# Patient Record
Sex: Male | Born: 1940 | Race: Black or African American | Hispanic: No | Marital: Married | Smoking: Former smoker
Health system: Southern US, Community
[De-identification: ages and names within clinical notes are randomized; demographics above are authoritative.]

## PROBLEM LIST (undated history)

## (undated) DIAGNOSIS — K219 Gastro-esophageal reflux disease without esophagitis: Secondary | ICD-10-CM

## (undated) DIAGNOSIS — R011 Cardiac murmur, unspecified: Secondary | ICD-10-CM

## (undated) DIAGNOSIS — I10 Essential (primary) hypertension: Secondary | ICD-10-CM

## (undated) DIAGNOSIS — C8590 Non-Hodgkin lymphoma, unspecified, unspecified site: Secondary | ICD-10-CM

## (undated) DIAGNOSIS — J45909 Unspecified asthma, uncomplicated: Secondary | ICD-10-CM

## (undated) DIAGNOSIS — C801 Malignant (primary) neoplasm, unspecified: Secondary | ICD-10-CM

## (undated) DIAGNOSIS — I639 Cerebral infarction, unspecified: Secondary | ICD-10-CM

## (undated) HISTORY — PX: ANGIOPLASTY: SHX39

## (undated) HISTORY — DX: Unspecified asthma, uncomplicated: J45.909

## (undated) HISTORY — DX: Cardiac murmur, unspecified: R01.1

## (undated) HISTORY — DX: Cerebral infarction, unspecified: I63.9

## (undated) HISTORY — DX: Essential (primary) hypertension: I10

## (undated) HISTORY — DX: Gastro-esophageal reflux disease without esophagitis: K21.9

---

## 2000-01-26 ENCOUNTER — Inpatient Hospital Stay (HOSPITAL_COMMUNITY): Admission: EM | Admit: 2000-01-26 | Discharge: 2000-01-30 | Payer: Self-pay | Admitting: Emergency Medicine

## 2000-01-26 ENCOUNTER — Encounter: Payer: Self-pay | Admitting: Emergency Medicine

## 2000-01-27 ENCOUNTER — Encounter: Payer: Self-pay | Admitting: Internal Medicine

## 2000-02-21 ENCOUNTER — Encounter: Admission: RE | Admit: 2000-02-21 | Discharge: 2000-02-21 | Payer: Self-pay | Admitting: Hematology and Oncology

## 2000-06-20 ENCOUNTER — Encounter: Payer: Self-pay | Admitting: Emergency Medicine

## 2000-06-20 ENCOUNTER — Inpatient Hospital Stay (HOSPITAL_COMMUNITY): Admission: EM | Admit: 2000-06-20 | Discharge: 2000-06-24 | Payer: Self-pay | Admitting: Emergency Medicine

## 2000-06-23 ENCOUNTER — Encounter: Payer: Self-pay | Admitting: Internal Medicine

## 2006-04-04 ENCOUNTER — Emergency Department (HOSPITAL_COMMUNITY): Admission: EM | Admit: 2006-04-04 | Discharge: 2006-04-04 | Payer: Self-pay | Admitting: Emergency Medicine

## 2007-03-04 ENCOUNTER — Emergency Department (HOSPITAL_COMMUNITY): Admission: EM | Admit: 2007-03-04 | Discharge: 2007-03-05 | Payer: Self-pay | Admitting: Emergency Medicine

## 2010-11-12 ENCOUNTER — Emergency Department (HOSPITAL_COMMUNITY)
Admission: EM | Admit: 2010-11-12 | Discharge: 2010-11-12 | Disposition: A | Discharge status: Home / Self Care | Payer: Medicare PPO | Attending: Emergency Medicine | Admitting: Emergency Medicine

## 2010-11-12 ENCOUNTER — Emergency Department (HOSPITAL_COMMUNITY): Payer: Medicare PPO

## 2010-11-12 DIAGNOSIS — R061 Stridor: Secondary | ICD-10-CM | POA: Insufficient documentation

## 2010-11-12 DIAGNOSIS — R0602 Shortness of breath: Secondary | ICD-10-CM | POA: Insufficient documentation

## 2010-11-12 DIAGNOSIS — R634 Abnormal weight loss: Secondary | ICD-10-CM | POA: Insufficient documentation

## 2010-11-12 DIAGNOSIS — Z87898 Personal history of other specified conditions: Secondary | ICD-10-CM | POA: Insufficient documentation

## 2010-11-12 LAB — COMPREHENSIVE METABOLIC PANEL
ALT: 22 U/L (ref 0–53)
AST: 33 U/L (ref 0–37)
Alkaline Phosphatase: 70 U/L (ref 39–117)
CO2: 28 mEq/L (ref 19–32)
Calcium: 8.8 mg/dL (ref 8.4–10.5)
Chloride: 100 mEq/L (ref 96–112)
GFR calc Af Amer: >60 mL/min (ref >60)
GFR calc non Af Amer: >60 mL/min (ref >60)
Glucose, Bld: 109 mg/dL — ABNORMAL HIGH (ref 70–99)
Sodium: 137 mEq/L (ref 135–145)
Total Bilirubin: 0.5 mg/dL (ref 0.3–1.2)

## 2010-11-12 LAB — CBC
Hemoglobin: 13.5 g/dL (ref 13.0–17.0)
MCH: 24.1 pg — ABNORMAL LOW (ref 26.0–34.0)
MCHC: 33.2 g/dL (ref 30.0–36.0)
Platelets: 252 10*3/uL (ref 150–400)
RDW: 14.7 % (ref 11.5–15.5)

## 2010-11-12 LAB — DIFFERENTIAL
Basophils Absolute: 0.1 10*3/uL (ref 0.0–0.1)
Eosinophils Absolute: 0.1 10*3/uL (ref 0.0–0.7)
Lymphs Abs: 2.9 10*3/uL (ref 0.7–4.0)
Monocytes Absolute: 0.3 10*3/uL (ref 0.1–1.0)
Neutro Abs: 2.1 10*3/uL (ref 1.7–7.7)

## 2010-12-12 ENCOUNTER — Emergency Department (HOSPITAL_COMMUNITY)
Admission: EM | Admit: 2010-12-12 | Discharge: 2010-12-12 | Disposition: A | Discharge status: Home / Self Care | Payer: Medicare PPO | Attending: Emergency Medicine | Admitting: Emergency Medicine

## 2010-12-12 DIAGNOSIS — I10 Essential (primary) hypertension: Secondary | ICD-10-CM | POA: Insufficient documentation

## 2010-12-12 DIAGNOSIS — R112 Nausea with vomiting, unspecified: Secondary | ICD-10-CM | POA: Insufficient documentation

## 2010-12-12 DIAGNOSIS — Z87898 Personal history of other specified conditions: Secondary | ICD-10-CM | POA: Insufficient documentation

## 2010-12-17 DIAGNOSIS — I639 Cerebral infarction, unspecified: Secondary | ICD-10-CM

## 2010-12-17 HISTORY — DX: Cerebral infarction, unspecified: I63.9

## 2010-12-22 ENCOUNTER — Emergency Department (HOSPITAL_COMMUNITY): Payer: Medicare PPO

## 2010-12-22 ENCOUNTER — Encounter (HOSPITAL_COMMUNITY): Payer: Self-pay | Admitting: Radiology

## 2010-12-22 ENCOUNTER — Inpatient Hospital Stay (HOSPITAL_COMMUNITY)
Admission: EM | Admit: 2010-12-22 | Discharge: 2010-12-24 | DRG: 065 | Disposition: A | Discharge status: Home Health | Payer: Medicare PPO | Attending: Internal Medicine | Admitting: Internal Medicine

## 2010-12-22 DIAGNOSIS — Y92009 Unspecified place in unspecified non-institutional (private) residence as the place of occurrence of the external cause: Secondary | ICD-10-CM

## 2010-12-22 DIAGNOSIS — E876 Hypokalemia: Secondary | ICD-10-CM | POA: Diagnosis present

## 2010-12-22 DIAGNOSIS — M6282 Rhabdomyolysis: Secondary | ICD-10-CM | POA: Diagnosis present

## 2010-12-22 DIAGNOSIS — R112 Nausea with vomiting, unspecified: Secondary | ICD-10-CM | POA: Diagnosis present

## 2010-12-22 DIAGNOSIS — Z87898 Personal history of other specified conditions: Secondary | ICD-10-CM

## 2010-12-22 DIAGNOSIS — I10 Essential (primary) hypertension: Secondary | ICD-10-CM | POA: Diagnosis present

## 2010-12-22 DIAGNOSIS — R42 Dizziness and giddiness: Secondary | ICD-10-CM | POA: Diagnosis present

## 2010-12-22 DIAGNOSIS — I6350 Cerebral infarction due to unspecified occlusion or stenosis of unspecified cerebral artery: Principal | ICD-10-CM | POA: Diagnosis present

## 2010-12-22 DIAGNOSIS — W010XXA Fall on same level from slipping, tripping and stumbling without subsequent striking against object, initial encounter: Secondary | ICD-10-CM | POA: Diagnosis present

## 2010-12-22 HISTORY — DX: Malignant (primary) neoplasm, unspecified: C80.1

## 2010-12-22 LAB — DIFFERENTIAL
Basophils Relative: 1 % (ref 0–1)
Eosinophils Relative: 1 % (ref 0–5)
Monocytes Absolute: 0.6 10*3/uL (ref 0.1–1.0)
Neutro Abs: 6.2 10*3/uL (ref 1.7–7.7)
Neutrophils Relative %: 78 % — ABNORMAL HIGH (ref 43–77)

## 2010-12-22 LAB — CBC
HCT: 38.7 % — ABNORMAL LOW (ref 39.0–52.0)
MCH: 23.6 pg — ABNORMAL LOW (ref 26.0–34.0)
MCV: 72.5 fL — ABNORMAL LOW (ref 78.0–100.0)
RBC: 5.34 MIL/uL (ref 4.22–5.81)
RDW: 15.2 % (ref 11.5–15.5)
WBC: 8 10*3/uL (ref 4.0–10.5)

## 2010-12-22 LAB — COMPREHENSIVE METABOLIC PANEL
Alkaline Phosphatase: 72 U/L (ref 39–117)
BUN: 7 mg/dL (ref 6–23)
Chloride: 101 mEq/L (ref 96–112)
Creatinine, Ser: 0.86 mg/dL (ref 0.4–1.5)
Glucose, Bld: 116 mg/dL — ABNORMAL HIGH (ref 70–99)
Potassium: 2.9 mEq/L — ABNORMAL LOW (ref 3.5–5.1)
Total Bilirubin: 0.3 mg/dL (ref 0.3–1.2)

## 2010-12-22 LAB — POCT CARDIAC MARKERS: Myoglobin, poc: 218 ng/mL (ref 12–200)

## 2010-12-22 LAB — CARDIAC PANEL(CRET KIN+CKTOT+MB+TROPI)
Total CK: 902 U/L — ABNORMAL HIGH (ref 7–232)
Troponin I: <0.3 ng/mL (ref <0.30)

## 2010-12-23 ENCOUNTER — Inpatient Hospital Stay (HOSPITAL_COMMUNITY): Payer: Medicare PPO

## 2010-12-23 DIAGNOSIS — I359 Nonrheumatic aortic valve disorder, unspecified: Secondary | ICD-10-CM

## 2010-12-23 DIAGNOSIS — I6350 Cerebral infarction due to unspecified occlusion or stenosis of unspecified cerebral artery: Secondary | ICD-10-CM

## 2010-12-23 LAB — BASIC METABOLIC PANEL
BUN: 5 mg/dL — ABNORMAL LOW (ref 6–23)
CO2: 25 mEq/L (ref 19–32)
Calcium: 8.8 mg/dL (ref 8.4–10.5)
Creatinine, Ser: 0.84 mg/dL (ref 0.4–1.5)
Glucose, Bld: 90 mg/dL (ref 70–99)

## 2010-12-23 LAB — COMPREHENSIVE METABOLIC PANEL
AST: 23 U/L (ref 0–37)
Albumin: 3.5 g/dL (ref 3.5–5.2)
Chloride: 101 mEq/L (ref 96–112)
Creatinine, Ser: 0.99 mg/dL (ref 0.4–1.5)
GFR calc Af Amer: >60 mL/min (ref >60)
Total Bilirubin: 0.3 mg/dL (ref 0.3–1.2)

## 2010-12-23 LAB — CARDIAC PANEL(CRET KIN+CKTOT+MB+TROPI)
Relative Index: 0.8 (ref 0.0–2.5)
Troponin I: <0.3 ng/mL (ref <0.30)

## 2010-12-23 LAB — CBC
MCH: 23.8 pg — ABNORMAL LOW (ref 26.0–34.0)
MCV: 73.5 fL — ABNORMAL LOW (ref 78.0–100.0)
Platelets: 220 10*3/uL (ref 150–400)
RBC: 5.43 MIL/uL (ref 4.22–5.81)
RDW: 15.3 % (ref 11.5–15.5)

## 2010-12-23 LAB — LIPID PANEL
Cholesterol: 155 mg/dL (ref 0–200)
HDL: 93 mg/dL (ref >39)
Total CHOL/HDL Ratio: 1.7 RATIO
Triglycerides: 37 mg/dL (ref <150)
VLDL: 7 mg/dL (ref 0–40)

## 2010-12-23 MED ORDER — GADOBENATE DIMEGLUMINE 529 MG/ML IV SOLN
15.0000 mL | Freq: Once | INTRAVENOUS | Status: AC | PRN
  Administered 2010-12-23: 15 mL via INTRAVENOUS

## 2010-12-23 MED ORDER — IOHEXOL 350 MG/ML SOLN
100.0000 mL | Freq: Once | INTRAVENOUS | Status: AC | PRN
  Administered 2010-12-23: 100 mL via INTRAVENOUS

## 2010-12-24 LAB — URINALYSIS, ROUTINE W REFLEX MICROSCOPIC
Glucose, UA: NEGATIVE mg/dL
Ketones, ur: NEGATIVE mg/dL
Specific Gravity, Urine: 1.023 (ref 1.005–1.030)
pH: 6 (ref 5.0–8.0)

## 2010-12-24 LAB — LIPID PANEL
Cholesterol: 132 mg/dL (ref 0–200)
HDL: 76 mg/dL (ref >39)
LDL Cholesterol: 46 mg/dL (ref 0–99)
Triglycerides: 50 mg/dL (ref <150)

## 2010-12-27 NOTE — Consult Note (Signed)
NAME:  LOGHAN, KURTZMAN NO.:  [id number]  MEDICAL RECORD NO.:  [id number]           PATIENT TYPE:  I  LOCATION:  1408                         FACILITY:  [facility]  PHYSICIAN:  Thana Farr, MD    DATE OF BIRTH:  May 03, 1941  DATE OF CONSULTATION:  12/23/2010 DATE OF DISCHARGE:                                CONSULTATION   REFERRING PHYSICIAN:  Thad Ranger, MD  HISTORY:  Mr. Dahir is a 70 year old male who reports that he had been having some symptoms of feeling a little dizzy and nauseous, but yesterday he had acute onset of dizziness.  He was unable to maintain himself upright and fell to the ground.  Was able to crawl into the house and then had multiple episodes of nauseousness and vomiting.  The patient presented to the ED at that time.  Workup has included MRI of the head that shows a right-sided inferior cerebellovermal infarct.  MRA has also been performed that shows occlusion of the right vertebral artery.  There was a question of dissection.  Consult called for further recommendations.  PAST MEDICAL HISTORY:  Hypertension, asthma, lymphoma status post chemo and radiation.  MEDICATIONS:  Aspirin, lisinopril, Zofran, Protonix, meclizine, subcu heparin, Phenergan .  ALLERGIES:  No known drug allergies.  PHYSICAL EXAMINATION:  VITAL SIGNS:  Blood pressure 193/88, heart rate 52, respiratory rate 20, temperature 98.3. MENTAL STATUS:  On mental status testing, the patient is alert and oriented.  He can follow commands without difficulty.  Speech is fluent. On cranial nerve testing:  II, disks flat bilaterally, visual fields grossly intact.  III, IV, VI, extraocular movements intact.  V and VII, mild left facial droop.  VIII, grossly intact.  IX and X, positive gag. XI, bilateral shoulder shrug.  XII, midline tongue extension.  On motor exam, the patient is 5/5 throughout.  There is normal tone and bulk. Sensory, pinprick and light touch are intact  bilaterally.  Deep tendon reflexes are 1+ in the upper extremities, trace at the knees and absent at the ankles.  Plantars, mute on the left and downgoing on the right. On cerebellar testing, finger-to-nose and heel-to-shin intact bilaterally.  LABORATORY DATA:  Shows a sodium of 136, potassium 3.5, chloride 102, bicarb of 25, BUN 5, creatinine 0.84, glucose 90, hemoglobin and hematocrit of 12.6 and 38.7 respectively.  White blood cell count 8.0, hemoglobin A1c 5.9, triglycerides 37, cholesterol 155, HDL 93, LDL 55. Films reviewed.  ASSESSMENT:  Mr. Teare is a 70 year old male who has had a left cerebellar infarct.  Risk factors include hypertension.  MRA imaging shows occlusion of the left vertebral and possible dissection.  On viewing the films, it does seem that this is much more likely an occlusion than a dissection.  Embolus is a possible etiology due to its abrupt cutoff.  Further workup is indicated.  PLAN: 1. The patient to have echocardiogram. 2. The patient to continue on aspirin 325 mg daily for stroke     prophylaxis.  We would not start any medications     such as heparin and/or Coumadin unless a true embolic source is  noted. 3. PT consult. 4. CTA of the neck.          ______________________________ Thana Farr, MD     LR/MEDQ  D:  12/23/2010  T:  12/23/2010  Job:  [id number]  Electronically Signed by Thana Farr MD on 12/27/2010 07:58:20 AM

## 2010-12-29 NOTE — Discharge Summary (Signed)
NAME:  Derek Stevens, Derek Stevens             ACCOUNT NO.:  [id number]  MEDICAL RECORD NO.:  [id number]           PATIENT TYPE:  I  LOCATION:  1408                         FACILITY:  [facility]  PHYSICIAN:  Thad Ranger, MD       DATE OF BIRTH:  1941/04/19  DATE OF ADMISSION:  12/22/2010 DATE OF DISCHARGE:                        DISCHARGE SUMMARY - REFERRING   PRIMARY CARE PHYSICIAN:  Tanya D. Daphine Deutscher, M.D.; [facility].  DISCHARGE DIAGNOSES: 1. Acute cerebellar cerebrovascular accident. 2. Hypertension, poorly controlled. 3. Hyperlipidemia. 4. History of asthma. 5. Nausea and vomiting, improved. 6. Occlusion of the vertebral artery, bilateral, at origin.  CONSULTATION:  Neurology, Thana Farr, MD.  DISCHARGE MEDICATIONS:  Include amlodipine 10 mg p.o. daily, aspirin 325 mg p.o. daily, lisinopril 20 mg p.o. daily at bedtime, meclizine 12.5 mg p.o. b.i.d., Protonix 40 mg p.o. daily, Promethazine 12.5 mg p.o. every 6 hours as needed for nausea and vomiting, Crestor 10 mg p.o. daily, Colace 100 mg p.o. twice daily as needed, eyedrops saline 1 drop in both eyes daily as needed.  BRIEF HISTORY OF PRESENT ILLNESS:  At the time of admission, briefly Derek Stevens is a 70 year old male who presented with acute dizziness resulting in fall and persistent nausea and vomiting.  For details, please refer to admission note dictated by Dr. Althea Charon on Dec 22, 2010.  RADIOLOGICAL DATA:  Chest x-ray two-view, Dec 22, 2010, no active cardiopulmonary disease, stable chest.  CT head without contrast on Dec 22, 2010, no acute intracranial findings, periventricular white matter disease.  MRI of the brain on Dec 23, 2010, showed acute/subacute infarct involving the right side of the inferior cerebellar vermis likely accounting for the patient's acute onset of dizziness.  Diffuse white matter disease was advanced for age, this likely reflects sequelae of chronic microvascular ischemia.   Minimal mucosal thickening throughout the paranasal sinuses as described.  Multiple punctate areas of susceptibility compatible with remote hemorrhage, this suggests the possibility of amyloid angiopathy.  MRA, absent signal  in the right vertebral artery with reconstitution in the terminal, 1 cm, this is concerning with occlusion of his proximal stenosis or potentially destruction.  Mild distal small vessel disease.  CT angiogram of neck on Dec 23, 2010, showed vertebral arteries are occluded at the origins bilaterally.  The left vertebral artery is reconstituted at C5-C6 but remains irregular throughout its course in the neck.  Right  vertebral artery is partially reconstituted at the skull base.  Intracranial left vertebral artery appears normal.  A 2-D echocardiogram on Dec 23, 2010, showed EF of 60%, normal wall motion, and no wall motion abnormalities.  LABORATORY DATA:  Pertinent lab diagnostic data, HbA1c 6.0.  UA negative for any UTI or proteinuria.  Lipid profile showed cholesterol 132, HDL 76, LDL 46.  BRIEF HOSPITALIZATION COURSE:  Derek Stevens is a 70 year old male who presented with persistent nausea, vomiting and acute dizziness resulting in fall: 1. Persistent nausea, vomiting and acute dizziness/acute cerebellar     infarct.  The patient was admitted to the hospitalist service and     was started on IV fluids and antiemetics.  CT head was  obtained,     however, did not show any acute intracranial findings to further     workup and rule out any stroke.  MRI was obtained which did confirm     cerebellar acute/subacute CVA.  Stroke workup was initiated and the     patient was placed on full-dose aspirin, BP control and the     statins.  MRA did show occlusion at the region of the vertebral     arteries.  Neurology was consulted and per recommendation CT     angiogram of the neck was obtained.  The patient does have     reconstitution of the vertebral arteries and  collaterals and per     Neurology no other further workup is needed at this point except     risk modification.  This was clearly explained to the patient and     his family by myself and Dr. Thana Farr.  Physical Therapy     consultation was obtained and the patient will need home PT/OT     setup at the time of discharge. 2. Hypertension, was poorly controlled.  The patient was started on     lisinopril and Norvasc.  The patient will be discharged home today.  DISCHARGE FOLLOWUP:  With Dr. Alwyn Pea within next 2 weeks.  DISCHARGE PHYSICAL EXAMINATION:  VITAL SIGNS:  At the time of discharge, temperature 98.4, pulse 62, respirations 16, blood pressure 158/81, O2 sats 98% on room air. GENERAL:  The patient is alert, awake and oriented x3, not in any acute distress. HEENT:  Anicteric sclerae, pink conjunctivae.  Pupils are reactive to light and accommodation.  EOMI. NECK:  Supple.  No lymphopathy, no JVD. CVS:  S1, S2 clear.  Regular rate and rhythm. CHEST:  Clear to auscultation bilaterally. ABDOMEN:  Soft, nontender, nondistended.  Normal bowel sounds. EXTREMITIES:  No cyanosis, clubbing or edema noted in upper or lower extremities bilaterally.  DISCHARGE TIME:  35 minutes.     Thad Ranger, MD     RR/MEDQ  D:  12/24/2010  T:  12/24/2010  Job:  [id number]  cc:   Tanya D. Daphine Deutscher, M.D. Fax: [phone]  Thana Farr, MD  Electronically Signed by Andres Labrum Charese Abundis  on 12/29/2010 01:35:14 PM

## 2010-12-30 NOTE — H&P (Signed)
[place]  Patient:    Derek Stevens, Derek Stevens                      MRN: [id number] Adm. Date:  01/26/00 Attending:  Madaline Guthrie, M.D. Dictator:   Rennis Petty, M.D.                         History and Physical  HISTORY OF PRESENT ILLNESS:  Mr. Burgeson is a 70 year old African-American male with past medical history positive for Hodgkins lymphoma diagnosed in 1994.  He presented to the ED with complaints of subjective fever and chills, night sweats, nonproductive cough for the last six days.  He also has bilateral upper chest pain which is constant, nonpleuritic in nature and no associated shortness of breath.  He denies sick contacts.  He has a history of unintentional 10-pound weight loss for the last month, abdominal pain two days ago associated with diarrhea, but no nausea or vomiting.  He denies bright red blood per rectum, blood in stool, melena.  Denies dysuria or hematuria or hesitancy.  Denies rashes or focal numbness or weakness.  He does have a bifrontal headache times the last two days but no particular visual changes.  PAST MEDICAL HISTORY: 1. 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    a. Status post radiation and chemotherapy in 1994.    b. Stage IIA. 2. Untreated hypertension. 3. Chronic sinusitis. 4. Childhood asthma.  FAMILY HISTORY:  His mother has breast cancer as well as cerebral aneurysm. Her age is 64.  His father and brother have lung cancer.  No history of coronary artery disease in the family.  SOCIAL HISTORY:  Lives with his wife in [place].  Does concrete work. Remote history of tobacco abuse 20 years ago.  Remote history of alcohol use. No history of IV drug abuse.  ALLERGIES:  No known drug allergies.  MEDICATIONS:  None.  PHYSICAL EXAMINATION:  VITAL SIGNS:  Temperature 97.5, blood pressure 159/86, heart rate 106, respiratory rate 20, O2 saturation 95% on room air.  GENERAL:  Patient is ill-appearing;  however, alert.  HEENT:  He has bilateral gray tympanic membranes.  He has a coated tongue. Mild erythema in his posterior pharynx; otherwise, NAD.  NECK:  Supple.  No lymphadenopathy.  No bruit.  LUNGS:  Crackles, right base.  Egophony, right base as well as mid-lung fields.  Poor air movement, right base.  CARDIOVASCULAR:  Regular with frequent ectopy.  A 2/6 systolic murmur at apex which does not radiate.  ABDOMEN:  Positive bowel sounds.  Nontender.  No hepatosplenomegaly.  EXTREMITIES:  No pedal edema.  SKIN:  No rash.  NEUROLOGIC:  Cranial nerves intact.  Motor 5/5 throughout.  Deep tendon reflexes globally reduced.  Plantar reflex downgoing.  LABORATORY AND X-RAY FINDINGS:  Sodium 121, potassium 3.6, chloride 85, bicarb 25, BUN 8, creatinine 1.3, glucose 138, calcium 9.3, total protein 8.1, albumin 3.3, SGOT 282, SGPT 92, alkaline phosphatase 69, total bilirubin 0.9. Liver function tests within normal limits in 1996.  CBC includes WBC of 11.8, hemoglobin 13.1, MCV of 73, platelets 242,000.  EKG:  Normal sinus rhythm.  No ST-T wave changes.  Normal axis.  Chest x-ray:  Right lower lobe as well as right middle lobe pneumonia infiltrate, known effusions.  ASSESSMENT:  Mr. Lenn is a 70 year old gentleman with history of Hodgkins lymphoma that is stage IIA, status post radiation and chemotherapy. Based on his chest  x-ray as well as examination findings, he could have a community-acquired pneumonia.  1. Pneumonia:  Mr. Rash most likely has a community/aspiration pneumonia.    Though he denies his alcohol abuse, he has had multiple records which have    reported that he has abused alcohol as recent as 1996; therefore, common    pathogens would include Streptococcus pneumoniae of most, however, he may    have tendencies for aspiration and he could have anaerobes as well.  This    is also consistent with, as he has a history of Hodgkins and chemotherapy,    a  postobstructive process that he could also have, which is not visible on    chest x-ray; therefore, we will start Zosyn/______ which will cover both    Streptococcus pneumoniae as well as anaerobes. 2. Hyponatremia:  Patients sodium is 121.  It has been normal in the past.    In the emergency department, he did receive 500 cc normal saline bolus.  We    will check urine sodium, urine osmolality and serum osmolality.  Most    likely, based on his urinalysis which reveals a normal specific gravity as    well as his clinical examination, I feel he either has euvolemic or    hypervolemic hyponatremia; therefore, we will fluid restrict him today and    review his results and make decisions accordingly. 3. Elevated liver function tests:  As I stated previously, his liver function    tests were normal in 1996; therefore, we have to wonder if this is    alcoholic hepatitis/fatty liver, acute versus chronic hepatitis as well as    metastasis to his liver.  We will start with starting with acute hepatitis    panel and he may require CT of his abdomen for further workup.  We should    also consider a CT chest, as he may have a mass which could be causing    metastasis to his liver. 4. Hyperglycemia:  Will follow complete blood glucoses early in the morning    and follow him.  ______ will be the primary physician taking care of this    patient during his hospitalization. DD:  01/26/00 TD:  01/27/00 Job: [id number]

## 2010-12-30 NOTE — Discharge Summary (Signed)
[place]  Patient:    Derek Stevens, Derek Stevens                    MRN: [id number] Adm. Date:  [id number] Disc. Date: [id number] Attending:  Edwyna Perfect Dictator:   Melissa L. Susann Givens, M.D.                           Discharge Summary  DATE OF BIRTH:  August 13, 1941  CONSULTANTS: 1. Cardiology.  [facility] was consulted secondary to the patients    history and complaints of substernal chest pain and pressure for two days    that occurred at rest.  He was started initially on heparin and    nitroglycerin and was found to have positive cardiac enzymes, which [facility]    felt was not indicative of myocardial injury, however the symptoms of chest    pain were worrisome for unstable angina.  Patient then went on to a cardiac    catheterization and was found to have no serious coronary artery disease.    His left ventricular ejection fraction was 55.4%.  He had mild global    hypokinesis. His aortic root was dilated but without aortic insufficiency.    Aortic dilation extended to the renals.  Renal arteries were found to be    patent.  Cardiology recommended a follow-up CT scan to rule out    thoracoabdominal aortic aneurysm, but test was performed and    thoracoabdominal aortic aneurysm was not found on CT.  Cardiology also    recommended for the patients history of hypertension a beta-blocker while    in hospital, but recommended, given his not having any coronary artery    disease to use diuretics, an Ace inhibitor or a calcium channel blocker to    control the patients blood pressure. 2. Hematology/Oncology.  They were consulted for the patients history of    Hodgkins lymphoma as well as, to rule out hemachromatosis.  The patient    was noted to be anemic on admission with a hemoglobin of 12.0 and MCV of    72, white blood cells of 5.9, and platelet count of 149.  The anemia    studies were done and indicated iron was 249, which was elevated  to    iron-binding capacity of 262% and percent saturation 95%.  Folate    was 242, serum ferritin was 3099, transferrin was 165.  Total bilirubin,    SGOT and SGPT were also elevated.  Peripheral smear showed polychromasia    and burr cells.  Per their assessment, hematology felt that a CT of the    chest, abdomen and pelvis were warranted to rule out recurrence of    lymphoma. That test was performed and there was no adenopathy noted on    CT.  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  The fact that his    ferritin serum iron were  elevated would go along with that diagnosis.    Per hematology, hemachromatosis is almost unheard of in African-American    population.  There recommendation was to counsel the patient to stop    drinking alcohol and recheck his parameters once he has abstained from    alcohol.  They did not recommend a liver biopsy at present and felt    there was no acute  need to lower his serum iron emergently.  PROCEDURE: Cardiac catheterization done on June 22, 2000.  INITIAL HISTORY AND PHYSICAL EXAMINATION:  This is a 70 year old African-American male with a history of Hodgkins lymphoma, who presented to the [facility] Emergency Department with a two day history of chest pain that had worsened on the day of admission.  Patient felt the pain was constant and radiated up his neck.  Last night before admission, patient had nightsweats and felt that nothing improved the pain and he felt the pain was a 10/10 when he arrived at the ED.  When I initially saw the patient, he felt the pain was down to a 3/4.  He complains of associated nausea, vomiting x 3 on the day of admission, but no fevers, chills and no symptoms of reflux.  PAST MEDICAL HISTORY: 1. 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 2. Untreated hypertension. 3. Lymphoma stage 2A  diagnosed in 1994, treated with radiation therapy and    chemotherapy.  MEDICATIONS:  None.  ALLERGIES:  No known allergies.  There is no personal history of GI bleed, coagulopathy or CVA.  FAMILY HISTORY:  His mother has hypertension and a cerebral aneurysm.  His father died of a blood clot and had lung cancer.  His brother has cancer of the lung.  SOCIAL HISTORY:  The patient lives with his wife of 36 years.  He has a history of tobacco use, but quit in the 70s after smoking cigars.  He admits to ETOH use at least three beers per day.  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  INITIAL LABORATORIES:  Troponin was 0.09, CK was 559 and CK-MB was 4.2, PT was 14.2, hemoglobin was 12.0 with an MCV of 72.  Blood pressures were 168/107. TSH was found to be 21.  INITIAL PHYSICAL EXAMINATION:  GENERAL:  He is awake and oriented x 3.  HEENT:  He has scleral icterus, but no cervical adenopathy, no supraclavicular adenopathy.  Mucous membranes are moist.  He had poor dentition.  There is no JVD, no bruits, and no jugular/hepatojugular reflux.  LUNGS:  Clear to auscultation bilaterally.  CHEST:  There was no chest pain to palpation.  Heart rate was regular without murmurs.  ABDOMEN:  Soft, nontender with good bowel sounds.  The liver was two fingerbreadths below the costal margin.  He had no splenomegaly or palpable masses.  Groin was without adenopathy.  VASCULAR EXAMINATION:  Femoral and brachial pulses were equal in intensity and simultaneous and upstrokes of the carotids had strong upstrokes.  EXTREMITIES:  No edema.  HOSPITAL COURSE:  Patient was admitted to the cardiac floor with a diagnosis of unstable angina, was started on Lopressor 50 mg p.o. b.i.d., Altace 2.5 mg p.o. q.d., nitroglycerin and heparin drip, as well as, Pepcid and aspirin. Patient went on to cardiac catheterization on day two of admission.  He was found to have no coronary artery disease.  Patient  on day three of admission underwent a CT as patient had two dye loads on day two of admission, it was felt to watch patient another 24 hours to ensure that his creatinine did not  rise, as his creatinine did not rise.  The patient was discharged to home on day four of admission.  He was stopped on the heparin, stopped on the nitroglycerin.  The beta-blocker was stopped as well, because it lowered his blood pressure too much.  He was changed to  Norvasc 2.5 mg q.day and he was started on Altace 5 mg everyday and he was also started on Synthroid 25 mcg everyday.  The patient was told to abstain from alcohol and was told not to drive, have any sexual activity or heavy lifting until this Tuesday.  The patient was informed that the outpatient clinic would call him with a follow-up appointment with Dr. ______ , who is his primary care doctor.  It is recommended that patient have his thyroid checked every month and that his Synthroid be increased by 25 mcg every month for three months, then evaluate whether or not it would be necessary to increase it further.  His electrolytes initially should be checked after a period of abstinence from alcohol and he is otherwise discharged home in stable condition. DD:  06/24/00 TD:  06/24/00 Job: [id number]

## 2010-12-30 NOTE — Discharge Summary (Signed)
. [facility]  Patient:    Derek Stevens, Derek Stevens                    MRN: [id number] Adm. Date:  [id number] Disc. Date: [id number] Attending:  Madaline Guthrie                           Discharge Summary  NO DICTATION DD:  01/26/00 TD:  01/31/00 Job: [id number]

## 2010-12-30 NOTE — Cardiovascular Report (Signed)
[place]. [facility]  Patient:    Derek Stevens, Derek Stevens                    MRN: [id number] Proc. Date: 06/22/00 Adm. Date:  [id number] Attending:  Edwyna Perfect CC:         C. Ulyess Mort, M.D.  Lewayne Bunting, M.D.  CV Laboratory   Cardiac Catheterization  INDICATIONS:  The patient was admitted with chest pain.  He also has hepatitis; a question as to whether this is related to alcohol or some other etiology.  He also has elevation of serum ferritin.  He has had a prior lymphoma with chest irradiation.  The current study was done to access coronary anatomy.  PROCEDURES: 1. Left heart catheterization. 2. Selective coronary arteriography. 3. Selective left ventriculography. 4. Proximal mid and distal aortography.  DESCRIPTION OF PROCEDURE:  The patient was brought to the catheterization lab and prepped and draped in the usual fashion.  Xylocaine 1% was used for local anesthesia.  Through an anterior puncture the right femoral artery was easily entered.  Ventriculography was performed in the RAO projection.  We noted that the aortic root appeared to be somewhat dilated and therefore aortic root aortography was performed.  Distal aortography was performed as well. Following this coronary arteriography was performed without complication.  He tolerated the procedure well and was taken to the holding area in satisfactory clinical condition.  HEMODYNAMICS:  The central aortic pressure was 152/91, LV pressure 143/6. There was no gradient on pullback across the aortic valve.  ANGIOGRAPHIC DATA:  VENTRICULOGRAPHY:  Ventriculography in the RAO projection reveals modest global hypokinesis.  Ejection fraction is 55%.  No segmental wall motion abnormalities noted.  The proximal aortic root appears to be dilated.  It is dilated in a generalized fashion.  This appears to extend all the way around to just above the renal arteries.  The renal arteries themselves  appear to be widely patent.   The left main coronary artery was free of critical disease.  The LAD courses to the apex and wraps the apical tip.  There are two diagonal branches.  All of these vessels appear to be free of critical disease.  The circumflex consists of a fairly large marginal branch that bifurcates distally.  This appears to be free of critical disease.  The right coronary artery is a dominant vessel providing a PDA and smaller posterolateral system.  The right coronary artery appears to be free of critical disease.  CONCLUSIONS: 1. Mild global hypokinesis. 2. Aortic root dilatation. 3. No critical coronary obstruction.  RECOMMENDATIONS: 1. Evaluate the patient for hemochromatosis. 2. CT scan to evaluate aortic size. DD:  06/22/00 TD:  06/23/00 Job: [id number]

## 2010-12-30 NOTE — Discharge Summary (Signed)
[place]. [facility]  Patient:    Derek Stevens, Derek Stevens                    MRN: [id number] Adm. Date:  [id number] Disc. Date: [id number] Attending:  Madaline Guthrie Dictator:   Rene Paci                           Discharge Summary  DATE OF BIRTH:  1941-07-17  DISCHARGE DIAGNOSES: 1. Right lower lobe pneumonia, resolving. 2. Hypothyroidism. 3. 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 4. Elevated liver function tests secondary to pneumonia, hepatitis negative,    resolving. 5. History of Hodgkins lymphoma of the thorax 1994, treated with radiation    and chemotherapy.  DISCHARGE MEDICATIONS: 1. Tequin 400 mg 1 tablet p.o. q.d. x 6 days. 2. Synthroid 50 mcg 1 tablet p.o. q.d.  PROCEDURES:  None.  CONSULTATIONS:  None.  FOLLOW-UP:  Hospital follow-up is scheduled for Thursday, February 21, 2000, at 10:30 a.m. at the [facility] with Dr. Felicity Coyer.  At this appointment, will review symptoms as related to hypothyroidism, as well as establish continuity of care.  The patient will need a repeat TSH level in approximately six weeks from now to adjust Synthroid dose as needed.  HISTORY OF PRESENT ILLNESS:  [age]-year-old African-American gentleman with past medical history of Hodgkins diagnosed in 1994, who presents to the emergency department with complaints of subjective fever and chills, night sweats, and a nonproductive cough for six days prior to admission.  He has had bilateral upper chest pain, nonpleuritic in nature, no shortness of breath. There is no radiation of his pain.  He denies any sick contacts.  He says he has unintentionally lost 10 pounds over the last month and had abdominal pain two days ago that was associated with diarrhea, but this has since resolved. No nausea or vomiting.  He denies hemoptysis.  He denies dysuria, hematuria, or hesitancy.  He denies any  rashes or focal numbness.  He has had no swelling.  ADMISSION PHYSICAL EXAMINATION:  VITAL SIGNS:  Temperature 97.5, later reaching 103.  Blood pressure 159/86, heart rate of 106, respiratory rate of 20, saturation 95% on room air.  GENERAL:  Ill-appearing, diaphoretic gentleman who is awake and alert, oriented x 4.  HEENT:  [place]/AT, PERRL, EOMI.  Tympanic membranes gray bilaterally.  Oropharynx with no lesions, dry tongue, mild erythema.  NECK:  Supple.  Without lymphadenopathy or thyromegaly.  LUNGS:  Significant decreased breath sounds at the right base with egophony. There are also crackles in the right and left base as you move anteriorly.  CARDIOVASCULAR:  Regular rate and rhythm with a 2/6 systolic murmur at the apex that does not radiate.  ABDOMEN:  Soft and nontender.  EXTREMITIES:  Without edema.  SKIN:  Without rash.  ADMISSION LABORATORY DATA:  Sodium of 121, potassium of 3.6, chloride of 85, bicarbonate of 25, BUN of 8, creatinine of 1.3, glucose of 138.  AST of 282, ALT of 92, alkaline phosphatase of 69, total bilirubin of 0.9.  White count of 11.8, hemoglobin of 13.1.  Chest x-ray shows a right lower lobe pneumonia, no effusions.  HOSPITAL COURSE: #1 - RIGHT LOWER LOBE PNEUMONIA:  The patient was admitted for treatment of this pneumonia primarily because of other associated problems such as his hyponatremia.  He was given Tequin IV for three days and  then converted without problem to p.o. Tequin.  He will be continued on this on an outpatient basis for a full 10-day course of his community-acquired pneumonia.  No sputum samples were ever obtained and, therefore, no culture results are available. He defervesced well, and his white count decreased accordingly to 6.4 by the time of discharge.  It is expected that he will recover well, without any residual complications as he completes his Tequin course at home as prescribed.  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  He was water restricted as is appropriate treatment for SIADH and, accordingly, his sodium resolved to a level of 133 at the time of discharge.  It is expected that as his pneumonia continues to resolve, so will his sodium correct itself.  Of note, see problem #3 for hypothyroidism for further details regarding sodium.  #3 - HYPOTHYROIDISM:  In the differential evaluation for hyponatremia, as hypothyroidism can cause low levels of sodium, a TSH was checked and found to be quite high.  This was repeated with a free T4 and again found to be consistent with hypothyroidism (TSH 37.6, FT4 is 0.67).  On the day of discharge the patient was begun on a starting dose of Synthroid 50 mcg a day and instructed on needing to take this medication daily.  TSH will be rechecked in approximately six weeks to adjust the Synthroid dose as needed. DD:  01/30/00 TD:  02/01/00 Job: [id number]

## 2011-01-02 NOTE — H&P (Signed)
NAME:  Derek Stevens, Derek Stevens NO.:  [id number]  MEDICAL RECORD NO.:  [id number]           PATIENT TYPE:  E  LOCATION:  WLED                         FACILITY:  [facility]  PHYSICIAN:  Zannie Cove, MD     DATE OF BIRTH:  [birth year]  DATE OF ADMISSION:  12/22/2010 DATE OF DISCHARGE:                             HISTORY & PHYSICAL   PRIMARY CARE PHYSICIAN:  The patient cannot recall at this point.  CHIEF COMPLAINT:  Dizziness, fall, nausea and vomiting.  HISTORY OF PRESENT ILLNESS:  Derek Stevens is a very pleasant 70 year old African American gentleman with remote history of lymphoma and hypertension who woke up this morning, ate breakfast, subsequently went to his garden and was working there doing nonexertional work and subsequently fell severely dizzy with the severe spinning sensation around him which caused him to fall.  He tried to stand up but he could not and he tried to lean against a tree which was next to him, subsequently called his way to his house since he could not stand due to the severe nausea.  In addition, he also felt a sensation of severe nausea and shaking in his arms and legs and this is accompanied by 5-6 episodes of vomiting.  Subsequently, he found his way inside his house, went and laid down on his bed, still felt severely dizzy.  Then his wife got home and they decided to get evaluate and called the EMS.  While EMS was evaluating him as well, he vomited couple of times.  Denies any abdominal pain except some vague abdominal discomfort after the vomiting episode which has since improved.  No history of fevers or chills.  No diarrhea, dysuria, or urinary frequency.  No changes in medications. For that matter, does not barely take any medicines.  He was here a month-and-half ago for pretty much the same thing wherein he felt similarly dizzy and fell and was given some Zofran and sent home and subsequently this is his second episode.  Denies any  history of headaches, seizures, trauma, etc.  PAST MEDICAL HISTORY: 1. Asthma. 2. Hypertension. 3. Lymphoma which was treated in 1994 with chemo and radiation,     considered in remission and disease free.  PAST SURGICAL HISTORY:  Significant for a lymph node biopsy.  SOCIAL HISTORY:  He is married with wife and has four children.  He lives with his wife at home.  Does not smoke cigarettes.  No history of illicit drug use.  Alcohol, he used to drink beer heavily before but cut down about 2 years ago and now drinks very occasionally, last drink was several weeks ago.  FAMILY HISTORY:  Significant for mother with CVA.  Father deceased secondary to COPD and lung cancer as well as brother with lung cancer.  REVIEW OF SYSTEMS:  Positive for weight loss and occasional symptoms of chronic ringing in his ears.  PHYSICAL EXAMINATION:  VITAL SIGNS:  Temperature is 98.2, pulse is 62, blood pressure 191/87, respirations 20, and saturating 96% on room air. GENERAL:  He is a [place] American gentleman who looks much older thanstated age. HEENT:  Pupils round  and reactive to light.  Extraocular movements intact.  No evidence of overt nystagmus.  Cranial nerves II-XII grossly intact. CARDIOVASCULAR:  S1-S2.  Regular rate and rhythm. LUNGS:  Clear to auscultation bilaterally. ABDOMEN:  Soft and nontender.  Positive bowel sounds.  No organomegaly. EXTREMITIES:  No edema, clubbing, or cyanosis. NEURO:  Strength is 5/5 in all extremities.  Reflexes diminished in his lower legs but 1-2, rest of his reflexes are 2/2.  Plantars are downgoing.  Coordination is intact.  No dysdiadochokinesia.  Gait not assessed.  REVIEW OF LABORATORY DATA:  White count 3.0, hemoglobin 12.6, and platelet count is adequate. BMET:  Sodium 137, potassium 2.9, chloride 101, bicarb 26, BUN 7, and creatinine 0.8.  Cardiac markers x1 negative. EKG shows normal sinus rhythm.  There is flattening of his T-waves in the  lateral leads, not significantly changed from prior.  ASSESSMENT AND PLAN:  Derek Stevens is a very pleasant 70 year old gentleman with remote history of lymphoma, now with: 1. Acute onset dizziness, fall, severe persistent nausea and vomiting. 2. Hypokalemia. 3. EKG with flat T-waves in inferior leads, unchanged from prior.  PLAN:  We will admit him to a tele bed today.  We will check an MRI to rule out posterior circulation infarct or cerebellar infarct.  We will start him on scheduled meclizine b.i.d. along with p.r.n. Zofran and Phenergan.  We will have physical therapy see him for vestibular rehab. If his current workup with MRI scans are negative, he will need referral to ENT to evaluate for Meniere disease or any other inner ear cause of vertigo.  We will also start him on aspirin 325 mg and check 2 sets of cardiac markers to make sure none of his symptoms are related to cardiac event.  Further management as condition evolves.     Zannie Cove, MD     PJ/MEDQ  D:  12/22/2010  T:  12/22/2010  Job:  [id number]  Electronically Signed by Zannie Cove  on 01/02/2011 08:07:20 PM

## 2011-02-10 ENCOUNTER — Emergency Department (HOSPITAL_COMMUNITY): Payer: Medicare PPO

## 2011-02-10 ENCOUNTER — Emergency Department (HOSPITAL_COMMUNITY)
Admission: EM | Admit: 2011-02-10 | Discharge: 2011-02-10 | Disposition: A | Discharge status: Home / Self Care | Payer: Medicare PPO | Attending: Emergency Medicine | Admitting: Emergency Medicine

## 2011-02-10 DIAGNOSIS — J45909 Unspecified asthma, uncomplicated: Secondary | ICD-10-CM | POA: Insufficient documentation

## 2011-02-10 DIAGNOSIS — E039 Hypothyroidism, unspecified: Secondary | ICD-10-CM | POA: Insufficient documentation

## 2011-02-10 DIAGNOSIS — Z9221 Personal history of antineoplastic chemotherapy: Secondary | ICD-10-CM | POA: Insufficient documentation

## 2011-02-10 DIAGNOSIS — I10 Essential (primary) hypertension: Secondary | ICD-10-CM | POA: Insufficient documentation

## 2011-02-10 DIAGNOSIS — R42 Dizziness and giddiness: Secondary | ICD-10-CM | POA: Insufficient documentation

## 2011-02-10 DIAGNOSIS — R112 Nausea with vomiting, unspecified: Secondary | ICD-10-CM | POA: Insufficient documentation

## 2011-02-10 DIAGNOSIS — Z87898 Personal history of other specified conditions: Secondary | ICD-10-CM | POA: Insufficient documentation

## 2011-02-10 LAB — URINALYSIS, ROUTINE W REFLEX MICROSCOPIC
Bilirubin Urine: NEGATIVE
Hgb urine dipstick: NEGATIVE
Ketones, ur: NEGATIVE mg/dL
Nitrite: NEGATIVE
Urobilinogen, UA: 0.2 mg/dL (ref 0.0–1.0)

## 2011-02-10 LAB — APTT: aPTT: 35 seconds (ref 24–37)

## 2011-02-10 LAB — CBC
HCT: 35.8 % — ABNORMAL LOW (ref 39.0–52.0)
MCH: 23.5 pg — ABNORMAL LOW (ref 26.0–34.0)
MCHC: 32.7 g/dL (ref 30.0–36.0)
MCV: 71.9 fL — ABNORMAL LOW (ref 78.0–100.0)
Platelets: 209 10*3/uL (ref 150–400)
RDW: 14.8 % (ref 11.5–15.5)

## 2011-02-10 LAB — BASIC METABOLIC PANEL
BUN: 5 mg/dL — ABNORMAL LOW (ref 6–23)
CO2: 29 mEq/L (ref 19–32)
Chloride: 105 mEq/L (ref 96–112)
Creatinine, Ser: 1 mg/dL (ref 0.50–1.35)
GFR calc Af Amer: >60 mL/min (ref >60)
Potassium: 3.7 mEq/L (ref 3.5–5.1)

## 2011-02-10 LAB — DIFFERENTIAL
Basophils Relative: 1 % (ref 0–1)
Eosinophils Absolute: 0.3 10*3/uL (ref 0.0–0.7)
Eosinophils Relative: 5 % (ref 0–5)
Lymphs Abs: 1.6 10*3/uL (ref 0.7–4.0)
Monocytes Absolute: 0.4 10*3/uL (ref 0.1–1.0)
Neutro Abs: 4 10*3/uL (ref 1.7–7.7)
Neutrophils Relative %: 62 % (ref 43–77)

## 2011-02-10 LAB — PROTIME-INR: INR: 1.02 (ref 0.00–1.49)

## 2011-05-29 LAB — I-STAT 8, (EC8 V) (CONVERTED LAB)
Bicarbonate: 24.1 — ABNORMAL HIGH
HCT: 43
Hemoglobin: 14.6
Operator id: 277751
Potassium: 3.8
Sodium: 127 — ABNORMAL LOW
TCO2: 25

## 2011-05-29 LAB — DIFFERENTIAL
Basophils Absolute: 0.1
Eosinophils Relative: 4
Lymphocytes Relative: 59 — ABNORMAL HIGH
Lymphs Abs: 2.6
Neutro Abs: 1.2 — ABNORMAL LOW

## 2011-05-29 LAB — POCT I-STAT CREATININE
Creatinine, Ser: 1.1
Operator id: 277751

## 2011-05-29 LAB — POCT CARDIAC MARKERS
CKMB, poc: 6.5
Operator id: 277751
Troponin i, poc: <0.05

## 2011-05-29 LAB — CBC
HCT: 37.7 — ABNORMAL LOW
Platelets: 263
RDW: 15.4 — ABNORMAL HIGH
WBC: 4.4

## 2012-07-23 ENCOUNTER — Encounter: Payer: Self-pay | Admitting: Gastroenterology

## 2012-08-06 ENCOUNTER — Encounter: Payer: Self-pay | Admitting: Gastroenterology

## 2012-08-06 ENCOUNTER — Ambulatory Visit (AMBULATORY_SURGERY_CENTER): Payer: Medicare PPO | Admitting: *Deleted

## 2012-08-06 VITALS — Ht 66.0 in | Wt 154.0 lb

## 2012-08-06 DIAGNOSIS — Z1211 Encounter for screening for malignant neoplasm of colon: Secondary | ICD-10-CM

## 2012-08-06 MED ORDER — MOVIPREP 100 G PO SOLR: 1.0000 | Freq: Once | ORAL | Status: DC

## 2012-08-16 ENCOUNTER — Encounter: Payer: Self-pay | Admitting: Gastroenterology

## 2012-08-16 ENCOUNTER — Ambulatory Visit (AMBULATORY_SURGERY_CENTER): Payer: Medicare PPO | Admitting: Gastroenterology

## 2012-08-16 VITALS — BP 138/78 | HR 46 | Temp 97.6°F | Resp 18

## 2012-08-16 DIAGNOSIS — D126 Benign neoplasm of colon, unspecified: Secondary | ICD-10-CM

## 2012-08-16 DIAGNOSIS — K635 Polyp of colon: Secondary | ICD-10-CM

## 2012-08-16 DIAGNOSIS — Z1211 Encounter for screening for malignant neoplasm of colon: Secondary | ICD-10-CM

## 2012-08-16 DIAGNOSIS — K5730 Diverticulosis of large intestine without perforation or abscess without bleeding: Secondary | ICD-10-CM

## 2012-08-16 MED ORDER — SODIUM CHLORIDE 0.9 % IV SOLN: 500.0000 mL | INTRAVENOUS | Status: DC

## 2012-08-16 NOTE — Progress Notes (Signed)
Per the pt he has a history of asthma.  He has an inhaler but according to the pt has not used it in years and does not have the inhaler with him today. Maw

## 2012-08-16 NOTE — Patient Instructions (Addendum)
YOU HAD AN ENDOSCOPIC PROCEDURE TODAY AT THE [facility]: Refer to the procedure report that was given to you for any specific questions about what was found during the examination.  If the procedure report does not answer your questions, please call your gastroenterologist to clarify.  If you requested that your care partner not be given the details of your procedure findings, then the procedure report has been included in a sealed envelope for you to review at your convenience later.  YOU SHOULD EXPECT: Some feelings of bloating in the abdomen. Passage of more gas than usual.  Walking can help get rid of the air that was put into your GI tract during the procedure and reduce the bloating. If you had a lower endoscopy (such as a colonoscopy or flexible sigmoidoscopy) you may notice spotting of blood in your stool or on the toilet paper. If you underwent a bowel prep for your procedure, then you may not have a normal bowel movement for a few days.  DIET: Your first meal following the procedure should be a light meal and then it is ok to progress to your normal diet.  A half-sandwich or bowl of soup is an example of a good first meal.  Heavy or fried foods are harder to digest and may make you feel nauseous or bloated.  Likewise meals heavy in dairy and vegetables can cause extra gas to form and this can also increase the bloating.  Drink plenty of fluids but you should avoid alcoholic beverages for 24 hours.  ACTIVITY: Your care partner should take you home directly after the procedure.  You should plan to take it easy, moving slowly for the rest of the day.  You can resume normal activity the day after the procedure however you should NOT DRIVE or use heavy machinery for 24 hours (because of the sedation medicines used during the test).    SYMPTOMS TO REPORT IMMEDIATELY: A gastroenterologist can be reached at any hour.  During normal business hours, 8:30 AM to 5:00 PM Monday through Friday,  call [phone].  After hours and on weekends, please call the GI answering service at [phone] who will take a message and have the physician on call contact you.   Following lower endoscopy (colonoscopy or flexible sigmoidoscopy):  Excessive amounts of blood in the stool  Significant tenderness or worsening of abdominal pains  Swelling of the abdomen that is new, acute  Fever of 100F or higher   FOLLOW UP: If any biopsies were taken you will be contacted by phone or by letter within the next 1-3 weeks.  Call your gastroenterologist if you have not heard about the biopsies in 3 weeks.  Our staff will call the home number listed on your records the next business day following your procedure to check on you and address any questions or concerns that you may have at that time regarding the information given to you following your procedure. This is a courtesy call and so if there is no answer at the home number and we have not heard from you through the emergency physician on call, we will assume that you have returned to your regular daily activities without incident.  SIGNATURES/CONFIDENTIALITY: You and/or your care partner have signed paperwork which will be entered into your electronic medical record.  These signatures attest to the fact that that the information above on your After Visit Summary has been reviewed and is understood.  Full responsibility of the confidentiality of   this discharge information lies with you and/or your care-partner.   Resume medications. Information given on polyps,diverticulosis and high fiber diet with discharge instructions. 

## 2012-08-16 NOTE — Progress Notes (Signed)
Patient did not experience any of the following events: a burn prior to discharge; a fall within the facility; wrong site/side/patient/procedure/implant event; or a hospital transfer or hospital admission upon discharge from the facility. (G8907) Patient did not have preoperative order for IV antibiotic SSI prophylaxis. (G8918)  

## 2012-08-16 NOTE — Op Note (Signed)
[facility] [address] [place], [address]   COLONOSCOPY PROCEDURE REPORT  PATIENT: Derek, Stevens  MR#: [id number] BIRTHDATE: [birth year] , 71  yrs. old GENDER: Male ENDOSCOPIST: Mardella Layman, MD, Clementeen Graham REFERRED BY:  Alwyn Pea, M.D. PROCEDURE DATE:  08/16/2012 PROCEDURE:   Colonoscopy with snare polypectomy ASA CLASS:   Class II INDICATIONS:Average risk patient for colon cancer. MEDICATIONS: Fentanyl-Detailed 180 mg IV  DESCRIPTION OF PROCEDURE:   After the risks and benefits and of the procedure were explained, informed consent was obtained.  A digital rectal exam revealed no abnormalities of the rectum.    The endoscope was introduced through the anus and advanced to the cecum, which was identified by both the appendix and ileocecal valve .  The quality of the prep was good, using MoviPrep .  The instrument was then slowly withdrawn as the colon was fully examined.     COLON FINDINGS: Images taken but only available in hard copy form that will be scanned into EPIC [employer]).   Moderate diverticulosis was noted.   A smooth flat polyp ranging between 3-83mm in size was found in the descending colon.  A polypectomy was performed with a cold snare.  The resection was complete and the polyp tissue was completely retrieved.   A polypoid shaped pedunculated polyp ranging between 3-7mm in size was found in the sigmoid colon.  A polypectomy was performed using snare cautery. The resection was complete and the polyp tissue was completely retrieved.     Retroflexed views revealed no abnormalities.     The scope was then withdrawn from the patient and the procedure completed.  COMPLICATIONS: There were no complications. ENDOSCOPIC IMPRESSION: 1.   Images taken but only available in hard copy form that will be scanned into EPIC [employer]). 2.   Moderate diverticulosis was noted 3.   Flat polyp ranging between 3-32mm in size was found in  the descending colon; polypectomy was performed with a cold snare 4.   Pedunculated polyp ranging between 3-60mm in size was found in the sigmoid colon; polypectomy was performed using snare cautery  RECOMMENDATIONS: 1.  Repeat colonoscopy in 5 years if polyp adenomatous; otherwise 10 years 2.  Await pathology results 3.  High fiber diet   REPEAT EXAM:  cc:  _______________________________ eSignedMardella Layman, MD, [facility] 08/16/2012 9:24 AM

## 2012-08-19 ENCOUNTER — Telehealth: Payer: Self-pay | Admitting: *Deleted

## 2012-08-19 ENCOUNTER — Encounter: Payer: Self-pay | Admitting: Gastroenterology

## 2012-08-19 NOTE — Telephone Encounter (Signed)
  Follow up Call-  Call back number 08/16/2012  Post procedure Call Back phone  # [phone] hm   Permission to leave phone message Yes     Patient questions:  Do you have a fever, pain , or abdominal swelling? no Pain Score  0 *  Have you tolerated food without any problems? yes  Have you been able to return to your normal activities? yes  Do you have any questions about your discharge instructions: Diet   no Medications  no Follow up visit  no  Do you have questions or concerns about your Care? no  Actions: * If pain score is 4 or above: No action needed, pain <4.

## 2012-08-20 ENCOUNTER — Encounter: Payer: Self-pay | Admitting: Gastroenterology

## 2015-08-15 ENCOUNTER — Observation Stay (HOSPITAL_COMMUNITY): Payer: Medicare HMO

## 2015-08-15 ENCOUNTER — Emergency Department (HOSPITAL_COMMUNITY): Payer: Medicare HMO

## 2015-08-15 ENCOUNTER — Inpatient Hospital Stay (HOSPITAL_COMMUNITY)
Admission: EM | Admit: 2015-08-15 | Discharge: 2015-08-20 | DRG: 312 | Disposition: A | Discharge status: Skilled Nursing Facility | Payer: Medicare HMO | Attending: Internal Medicine | Admitting: Internal Medicine

## 2015-08-15 ENCOUNTER — Encounter (HOSPITAL_COMMUNITY): Payer: Self-pay | Admitting: Emergency Medicine

## 2015-08-15 DIAGNOSIS — E038 Other specified hypothyroidism: Secondary | ICD-10-CM | POA: Diagnosis present

## 2015-08-15 DIAGNOSIS — E871 Hypo-osmolality and hyponatremia: Secondary | ICD-10-CM | POA: Diagnosis present

## 2015-08-15 DIAGNOSIS — N179 Acute kidney failure, unspecified: Secondary | ICD-10-CM | POA: Diagnosis present

## 2015-08-15 DIAGNOSIS — R55 Syncope and collapse: Secondary | ICD-10-CM | POA: Diagnosis not present

## 2015-08-15 DIAGNOSIS — K219 Gastro-esophageal reflux disease without esophagitis: Secondary | ICD-10-CM | POA: Diagnosis present

## 2015-08-15 DIAGNOSIS — Y92009 Unspecified place in unspecified non-institutional (private) residence as the place of occurrence of the external cause: Secondary | ICD-10-CM

## 2015-08-15 DIAGNOSIS — Z923 Personal history of irradiation: Secondary | ICD-10-CM

## 2015-08-15 DIAGNOSIS — G9340 Encephalopathy, unspecified: Secondary | ICD-10-CM | POA: Diagnosis present

## 2015-08-15 DIAGNOSIS — Z7982 Long term (current) use of aspirin: Secondary | ICD-10-CM

## 2015-08-15 DIAGNOSIS — Z8673 Personal history of transient ischemic attack (TIA), and cerebral infarction without residual deficits: Secondary | ICD-10-CM

## 2015-08-15 DIAGNOSIS — F1010 Alcohol abuse, uncomplicated: Secondary | ICD-10-CM | POA: Diagnosis present

## 2015-08-15 DIAGNOSIS — Z79899 Other long term (current) drug therapy: Secondary | ICD-10-CM

## 2015-08-15 DIAGNOSIS — Z8571 Personal history of Hodgkin lymphoma: Secondary | ICD-10-CM

## 2015-08-15 DIAGNOSIS — G459 Transient cerebral ischemic attack, unspecified: Secondary | ICD-10-CM

## 2015-08-15 DIAGNOSIS — M545 Low back pain, unspecified: Secondary | ICD-10-CM

## 2015-08-15 DIAGNOSIS — F10231 Alcohol dependence with withdrawal delirium: Secondary | ICD-10-CM | POA: Diagnosis present

## 2015-08-15 DIAGNOSIS — E860 Dehydration: Secondary | ICD-10-CM | POA: Diagnosis present

## 2015-08-15 DIAGNOSIS — Z87891 Personal history of nicotine dependence: Secondary | ICD-10-CM

## 2015-08-15 DIAGNOSIS — R296 Repeated falls: Secondary | ICD-10-CM | POA: Diagnosis present

## 2015-08-15 DIAGNOSIS — E785 Hyperlipidemia, unspecified: Secondary | ICD-10-CM | POA: Diagnosis present

## 2015-08-15 DIAGNOSIS — W1839XA Other fall on same level, initial encounter: Secondary | ICD-10-CM | POA: Diagnosis present

## 2015-08-15 DIAGNOSIS — Z9221 Personal history of antineoplastic chemotherapy: Secondary | ICD-10-CM

## 2015-08-15 DIAGNOSIS — I10 Essential (primary) hypertension: Secondary | ICD-10-CM | POA: Diagnosis present

## 2015-08-15 DIAGNOSIS — Z801 Family history of malignant neoplasm of trachea, bronchus and lung: Secondary | ICD-10-CM

## 2015-08-15 DIAGNOSIS — F10931 Alcohol use, unspecified with withdrawal delirium: Secondary | ICD-10-CM | POA: Diagnosis present

## 2015-08-15 DIAGNOSIS — J45909 Unspecified asthma, uncomplicated: Secondary | ICD-10-CM | POA: Diagnosis present

## 2015-08-15 LAB — I-STAT TROPONIN, ED: TROPONIN I, POC: 0.02 ng/mL (ref 0.00–0.08)

## 2015-08-15 LAB — CBC
HEMATOCRIT: 39.3 % (ref 39.0–52.0)
Hemoglobin: 13.4 g/dL (ref 13.0–17.0)
MCH: 24.2 pg — ABNORMAL LOW (ref 26.0–34.0)
MCHC: 34.1 g/dL (ref 30.0–36.0)
MCV: 71.1 fL — AB (ref 78.0–100.0)
Platelets: 206 10*3/uL (ref 150–400)
RBC: 5.53 MIL/uL (ref 4.22–5.81)
RDW: 14.6 % (ref 11.5–15.5)
WBC: 5 10*3/uL (ref 4.0–10.5)

## 2015-08-15 LAB — COMPREHENSIVE METABOLIC PANEL
ALT: 16 U/L — ABNORMAL LOW (ref 17–63)
ANION GAP: 14 (ref 5–15)
AST: 29 U/L (ref 15–41)
Albumin: 4.3 g/dL (ref 3.5–5.0)
Alkaline Phosphatase: 74 U/L (ref 38–126)
BILIRUBIN TOTAL: 0.8 mg/dL (ref 0.3–1.2)
BUN: 8 mg/dL (ref 6–20)
CO2: 22 mmol/L (ref 22–32)
Calcium: 8.7 mg/dL — ABNORMAL LOW (ref 8.9–10.3)
Chloride: 92 mmol/L — ABNORMAL LOW (ref 101–111)
Creatinine, Ser: 1.02 mg/dL (ref 0.61–1.24)
GFR calc Af Amer: >60 mL/min (ref >60)
Glucose, Bld: 105 mg/dL — ABNORMAL HIGH (ref 65–99)
POTASSIUM: 4.3 mmol/L (ref 3.5–5.1)
Sodium: 128 mmol/L — ABNORMAL LOW (ref 135–145)
TOTAL PROTEIN: 7.8 g/dL (ref 6.5–8.1)

## 2015-08-15 LAB — URINE MICROSCOPIC-ADD ON: Bacteria, UA: NONE SEEN

## 2015-08-15 LAB — URINALYSIS, ROUTINE W REFLEX MICROSCOPIC
BILIRUBIN URINE: NEGATIVE
Glucose, UA: NEGATIVE mg/dL
KETONES UR: NEGATIVE mg/dL
Leukocytes, UA: NEGATIVE
NITRITE: NEGATIVE
Protein, ur: NEGATIVE mg/dL
Specific Gravity, Urine: 1.006 (ref 1.005–1.030)
pH: 6 (ref 5.0–8.0)

## 2015-08-15 LAB — DIFFERENTIAL
BASOS ABS: 0 10*3/uL (ref 0.0–0.1)
BASOS PCT: 0 %
EOS PCT: 2 %
Eosinophils Absolute: 0.1 10*3/uL (ref 0.0–0.7)
LYMPHS ABS: 1.5 10*3/uL (ref 0.7–4.0)
Lymphocytes Relative: 30 %
MONO ABS: 0.5 10*3/uL (ref 0.1–1.0)
Monocytes Relative: 9 %
Neutro Abs: 2.9 10*3/uL (ref 1.7–7.7)
Neutrophils Relative %: 59 %

## 2015-08-15 LAB — LIPASE, BLOOD: Lipase: 45 U/L (ref 11–51)

## 2015-08-15 LAB — PROTIME-INR
INR: 1.01 (ref 0.00–1.49)
PROTHROMBIN TIME: 13.5 s (ref 11.6–15.2)

## 2015-08-15 LAB — APTT: APTT: 30 s (ref 24–37)

## 2015-08-15 MED ORDER — SENNOSIDES-DOCUSATE SODIUM 8.6-50 MG PO TABS: 1.0000 | ORAL_TABLET | Freq: Every evening | ORAL | Status: DC | PRN

## 2015-08-15 MED ORDER — VITAMIN B-1 100 MG PO TABS
100.0000 mg | ORAL_TABLET | Freq: Every day | ORAL | Status: DC
  Administered 2015-08-16: 100 mg via ORAL
  Filled 2015-08-15: qty 1

## 2015-08-15 MED ORDER — LISINOPRIL 20 MG PO TABS
20.0000 mg | ORAL_TABLET | Freq: Every day | ORAL | Status: DC
  Administered 2015-08-15 – 2015-08-16 (×2): 20 mg via ORAL
  Filled 2015-08-15 (×2): qty 1

## 2015-08-15 MED ORDER — ASPIRIN 325 MG PO TABS
325.0000 mg | ORAL_TABLET | Freq: Every day | ORAL | Status: DC
  Administered 2015-08-15 – 2015-08-20 (×6): 325 mg via ORAL
  Filled 2015-08-15 (×6): qty 1

## 2015-08-15 MED ORDER — PROMETHAZINE HCL 25 MG/ML IJ SOLN
12.5000 mg | INTRAMUSCULAR | Status: DC | PRN
  Administered 2015-08-15 – 2015-08-17 (×2): 12.5 mg via INTRAVENOUS
  Filled 2015-08-15 (×2): qty 1

## 2015-08-15 MED ORDER — LORAZEPAM 1 MG PO TABS: 1.0000 mg | ORAL_TABLET | Freq: Three times a day (TID) | ORAL | Status: DC

## 2015-08-15 MED ORDER — LORAZEPAM 1 MG PO TABS
1.0000 mg | ORAL_TABLET | Freq: Four times a day (QID) | ORAL | Status: DC | PRN
  Administered 2015-08-16 (×2): 1 mg via ORAL
  Filled 2015-08-15 (×2): qty 1

## 2015-08-15 MED ORDER — LORAZEPAM 1 MG PO TABS: 1.0000 mg | ORAL_TABLET | Freq: Every day | ORAL | Status: DC

## 2015-08-15 MED ORDER — ENSURE ENLIVE PO LIQD
237.0000 mL | Freq: Two times a day (BID) | ORAL | Status: DC
  Administered 2015-08-15 – 2015-08-20 (×4): 237 mL via ORAL

## 2015-08-15 MED ORDER — ONDANSETRON 4 MG PO TBDP: 4.0000 mg | ORAL_TABLET | Freq: Four times a day (QID) | ORAL | Status: AC | PRN

## 2015-08-15 MED ORDER — STROKE: EARLY STAGES OF RECOVERY BOOK
Freq: Once | Status: AC
  Administered 2015-08-15: 18:00:00
  Filled 2015-08-15: qty 1

## 2015-08-15 MED ORDER — LOPERAMIDE HCL 2 MG PO CAPS: 2.0000 mg | ORAL_CAPSULE | ORAL | Status: AC | PRN

## 2015-08-15 MED ORDER — ADULT MULTIVITAMIN W/MINERALS CH
1.0000 | ORAL_TABLET | Freq: Every day | ORAL | Status: DC
  Administered 2015-08-15 – 2015-08-20 (×4): 1 via ORAL
  Filled 2015-08-15 (×6): qty 1

## 2015-08-15 MED ORDER — CARVEDILOL 12.5 MG PO TABS
12.5000 mg | ORAL_TABLET | Freq: Two times a day (BID) | ORAL | Status: DC
  Administered 2015-08-15 – 2015-08-20 (×9): 12.5 mg via ORAL
  Filled 2015-08-15 (×8): qty 1
  Filled 2015-08-15: qty 2
  Filled 2015-08-15 (×2): qty 1

## 2015-08-15 MED ORDER — LORAZEPAM 1 MG PO TABS
1.0000 mg | ORAL_TABLET | Freq: Four times a day (QID) | ORAL | Status: DC
Start: 2015-08-15 — End: 2015-08-16
  Administered 2015-08-15 (×2): 1 mg via ORAL
  Filled 2015-08-15 (×2): qty 1

## 2015-08-15 MED ORDER — SODIUM CHLORIDE 0.9 % IV SOLN
INTRAVENOUS | Status: DC
  Administered 2015-08-15: 14:00:00 via INTRAVENOUS

## 2015-08-15 MED ORDER — MORPHINE SULFATE (PF) 4 MG/ML IV SOLN
4.0000 mg | Freq: Once | INTRAVENOUS | Status: AC
  Administered 2015-08-15: 4 mg via INTRAVENOUS
  Filled 2015-08-15: qty 1

## 2015-08-15 MED ORDER — HYDROXYZINE HCL 25 MG PO TABS
25.0000 mg | ORAL_TABLET | Freq: Four times a day (QID) | ORAL | Status: DC | PRN
  Administered 2015-08-16: 25 mg via ORAL
  Filled 2015-08-15: qty 1

## 2015-08-15 MED ORDER — LORAZEPAM 1 MG PO TABS: 1.0000 mg | ORAL_TABLET | Freq: Two times a day (BID) | ORAL | Status: DC

## 2015-08-15 MED ORDER — THIAMINE HCL 100 MG/ML IJ SOLN
100.0000 mg | Freq: Once | INTRAMUSCULAR | Status: DC
  Filled 2015-08-15: qty 1

## 2015-08-15 MED ORDER — ENOXAPARIN SODIUM 40 MG/0.4ML ~~LOC~~ SOLN
40.0000 mg | Freq: Every day | SUBCUTANEOUS | Status: DC
  Administered 2015-08-15 – 2015-08-18 (×4): 40 mg via SUBCUTANEOUS
  Filled 2015-08-15 (×4): qty 0.4

## 2015-08-15 NOTE — ED Notes (Signed)
Bed: [id number] Expected date: 08/15/15 Expected time: 8:45 AM Means of arrival: Ambulance Comments: Fall 2 days ago

## 2015-08-15 NOTE — ED Notes (Signed)
Pt from home via EMS- Per EMS, pt fell onto ground 2 days ago. Pt sts that he did not have pain until today. Pt wife sts that he fell in house last pm w/o injury. Pt has hx of stroke 2012. Pt is at baseline per wife. Pt has no obvious injuries or deformities. Pt is ambulatory and in NAD

## 2015-08-15 NOTE — ED Provider Notes (Signed)
CSN: [id number]     Arrival date & time 08/15/15  0846 History   First MD Initiated Contact with Patient 08/15/15 [phone]     Chief Complaint  Patient presents with  . Fall  . Back Pain   HPI Patient presents to the emergency room after a fall. Patient was outside chopping wood possibly 2 days ago. He fell backwards landing on the concrete. Patient is not sure why he fell. He denies having any loss of consciousness. He denied any trouble with numbness or weakness. He does have a history of stroke however in the past. Patient states that last night his wife told him that he fell 2 more times. He does not recall this happening. He also is complaining of pain in his lower back this morning that increases when he tries to move his legs. He still is able to walk and bear weight. He does not feel that his balance is particularly off. Eyes any difficulty with his speech. No acute numbness or focal weakness. This morning his wife told him he needed to come in to get checked out. Past Medical History  Diagnosis Date  . Cancer ([place])     lymphoma-1994  . Asthma   . Hypertension   . GERD (gastroesophageal reflux disease)   . Murmur, heart   . Stroke [facility]) 12/17/2010   Past Surgical History  Procedure Laterality Date  . Angioplasty     Family History  Problem Relation Age of Onset  . Lung cancer Father   . Lung cancer Brother   . Colon cancer Neg Hx   . Esophageal cancer Neg Hx   . Rectal cancer Neg Hx   . Stomach cancer Neg Hx    Social History  Substance Use Topics  . Smoking status: Former Smoker    Quit date: 08/15/1971  . Smokeless tobacco: Current User  . Alcohol Use: No    Review of Systems  Neurological: Negative for tremors, seizures, speech difficulty, light-headedness and numbness.  All other systems reviewed and are negative.     Allergies  Review of patient's allergies indicates no known allergies.  Home Medications   Prior to Admission medications   Medication Sig  Start Date End Date Taking? Authorizing Provider  aspirin 325 MG tablet Take 325 mg by mouth daily.   Yes Historical Provider, MD  carvedilol (COREG) 12.5 MG tablet Take 12.5 mg by mouth 2 (two) times daily with a meal.   Yes Historical Provider, MD  lisinopril (PRINIVIL,ZESTRIL) 20 MG tablet Take 20 mg by mouth daily.   Yes Historical Provider, MD  Omega-3 Fatty Acids (FISH OIL PO) Take 1 capsule by mouth daily.   Yes Historical Provider, MD  rosuvastatin (CRESTOR) 10 MG tablet Take 10 mg by mouth daily.   Yes Historical Provider, MD   BP 154/79 mmHg  Pulse 78  Temp(Src) 99.5 F (37.5 C) (Oral)  Resp 20  SpO2 98% Physical Exam  Constitutional: He is oriented to person, place, and time. He appears well-developed and well-nourished. No distress.  HENT:  Head: Normocephalic and atraumatic.  Right Ear: External ear normal.  Left Ear: External ear normal.  Mouth/Throat: Oropharynx is clear and moist.  Eyes: Conjunctivae are normal. Right eye exhibits no discharge. Left eye exhibits no discharge. No scleral icterus.  Neck: Neck supple. No tracheal deviation present.  Cardiovascular: Normal rate, regular rhythm and intact distal pulses.   Pulmonary/Chest: Effort normal and breath sounds normal. No stridor. No respiratory distress. He has no  wheezes. He has no rales.  Abdominal: Soft. Bowel sounds are normal. He exhibits no distension. There is no tenderness. There is no rebound and no guarding.  Musculoskeletal: He exhibits no edema.       Cervical back: Normal.       Thoracic back: Normal.       Lumbar back: He exhibits tenderness and bony tenderness. He exhibits no swelling and no edema.  Neurological: He is alert and oriented to person, place, and time. He has normal strength. A cranial nerve deficit (left  facial droop, extraocular movements intact, no slurred speech , tongue deviates to the right) is present. No sensory deficit. He exhibits normal muscle tone. He displays no seizure  activity. Coordination normal.  No pronator drift bilateral upper extrem, able to hold both legs off bed for 5 seconds, sensation intact in all extremities, no visual field cuts, no left or right sided neglect, normal finger-nose exam bilaterally, no nystagmus noted   Skin: Skin is warm and dry. No rash noted.  Psychiatric: He has a normal mood and affect.  Nursing note and vitals reviewed.   ED Course  Procedures (including critical care time) Labs Review Labs Reviewed  CBC - Abnormal; Notable for the following:    MCV 71.1 (*)    MCH 24.2 (*)    All other components within normal limits  COMPREHENSIVE METABOLIC PANEL - Abnormal; Notable for the following:    Sodium 128 (*)    Chloride 92 (*)    Glucose, Bld 105 (*)    Calcium 8.7 (*)    ALT 16 (*)    All other components within normal limits  PROTIME-INR  APTT  DIFFERENTIAL  URINALYSIS, ROUTINE W REFLEX MICROSCOPIC (NOT AT [facility])  Randolm Idol, ED    Imaging Review Dg Chest 2 View  08/15/2015  CLINICAL DATA:  Fall.  History of stroke. EXAM: CHEST  2 VIEW COMPARISON:  02/10/2011 FINDINGS: The heart size and mediastinal contours are within normal limits. Both lungs are clear. The visualized skeletal structures are unremarkable. IMPRESSION: No active cardiopulmonary disease. Electronically Signed   By: Nolon Nations M.D.   On: 08/15/2015 10:04   Dg Lumbar Spine Complete  08/15/2015  CLINICAL DATA:  Severe bilateral low back pain after multiple falls. EXAM: LUMBAR SPINE - COMPLETE 4+ VIEW COMPARISON:  None. FINDINGS: Mildly displaced fracture of the right twelfth rib is noted posteriorly of indeterminate age. No fracture or spondylolisthesis is seen involving the lumbar spine. Disc spaces are well-maintained. Anterior osteophyte formation is noted at L1-2, L2-3 and L3-4. Hypertrophy of posterior facet joints is seen at L5-S1 secondary to degenerative joint disease. IMPRESSION: Mild degenerative changes is seen involving the  lumbar spine. There is no evidence of fracture or spondylolisthesis involving the lumbar spine. Mildly displaced right twelfth rib fracture is noted of indeterminate age. Electronically Signed   By: Marijo Conception, M.D.   On: 08/15/2015 10:05   Dg Pelvis 1-2 Views  08/15/2015  CLINICAL DATA:  Fall.  Back pain. EXAM: PELVIS - 1-2 VIEW COMPARISON:  None. FINDINGS: There is no evidence of pelvic fracture or diastasis. No pelvic bone lesions are seen. IMPRESSION: Negative. Electronically Signed   By: Nolon Nations M.D.   On: 08/15/2015 10:06   Ct Head Wo Contrast  08/15/2015  CLINICAL DATA:  Fall 2 days ago. Repeat fall yesterday. History of stroke 4 years ago. EXAM: CT HEAD WITHOUT CONTRAST TECHNIQUE: Contiguous axial images were obtained from the base of the  skull through the vertex without intravenous contrast. COMPARISON:  02/10/2011 FINDINGS: Sinuses/Soft tissues: Minimal mucosal thickening of the sphenoid sinuses, ethmoid air cells. No significant soft tissue swelling. No skull fracture. Clear mastoid air cells. Intracranial: Moderate low density in the periventricular white matter likely related to small vessel disease. No mass lesion, hemorrhage, hydrocephalus, acute infarct, intra-axial, or extra-axial fluid collection. IMPRESSION: 1.  No acute intracranial abnormality. 2. Sinus disease. 3. Small vessel ischemic change. Electronically Signed   By: Abigail Miyamoto M.D.   On: 08/15/2015 09:38   I have personally reviewed and evaluated these images and lab results as part of my medical decision-making.   EKG Interpretation   Date/Time:  Sunday August 15 2015 09:17:43 EST Ventricular Rate:  76 PR Interval:  195 QRS Duration: 104 QT Interval:  390 QTC Calculation: 438 R Axis:   60 Text Interpretation:  Sinus rhythm Baseline wander in lead(s) II  nonspecific t wave changes compared to last tracing Confirmed by Demia Viera   MD-J, Jamauri Kruzel ([id number]) on 08/15/2015 9:21:26 AM      MDM   Final diagnoses:   Syncope, unspecified syncope type  Midline low back pain without sciatica    Pt cannot tell me why he fell.  Pt states his wife told him he fell a couple more times since Friday.  ?syncope vs possible recurrent stroke.  Will also xray lower back to evaluate for possible fx.  Confirmed history with wife.  Pt did have syncopal episodes where he suddenly fell.  Pt also has neuro deficits but unclear if this is all his old stroke versus possible subacute stroke.  May benefit form MRI brain, cardiac evaluation and further monitoring.  I will consult the medical service for admission.    Dorie Rank, MD 08/15/15 [phone]

## 2015-08-15 NOTE — H&P (Signed)
[facility] History and Physical  Derek Stevens [id number] DOB: Dec 20, 1940 DOA: 08/15/2015  74 ? 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  He has had a 2-3 day h/o calls.  He hurt his back Some assosc dizzyness preceding fall, NO CP NO blurred nor double vision NO unilateral weaknes NO visual symptoms no Sz like episodes NO "blacking out" He ha snot had falls liek this before No new medications No tongue biting No fever  No chills  No cough  No cold  Lab work up revealed a relatively new hyponatremia but no other significant findings Multiple MSK xray done were neg for acute #'s CT head neg for acute CVA EKG showed NSR , PR 0.12 QRS axis 70.  Mild peaking of T waves No findings suggesting ischemia  Mother had "5 strokes" Father was healthy Patient graduated from high school He is to do Architect work    Past Medical History  Diagnosis Date  . Cancer ([place])     lymphoma-1994  . Asthma   . Hypertension   . GERD (gastroesophageal reflux disease)   . Murmur, heart   . Stroke [facility]) 12/17/2010   Past Surgical History  Procedure Laterality Date  . Angioplasty     Social History:  Social History   Social History Narrative    No Known Allergies  Family History  Problem Relation Age of Onset  . Lung cancer Father   . Lung cancer Brother   . Colon cancer Neg Hx   . Esophageal cancer Neg Hx   . Rectal cancer Neg Hx   . Stomach cancer Neg Hx      Prior to Admission medications   Medication Sig Start Date End Date Taking? Authorizing Provider  aspirin 325 MG tablet Take 325 mg by mouth daily.   Yes Historical Provider, MD  carvedilol (COREG) 12.5 MG tablet Take 12.5 mg by mouth 2 (two) times daily with a meal.   Yes  Historical Provider, MD  lisinopril (PRINIVIL,ZESTRIL) 20 MG tablet Take 20 mg by mouth daily.   Yes Historical Provider, MD  Omega-3 Fatty Acids (FISH OIL PO) Take 1 capsule by mouth daily.   Yes Historical Provider, MD  rosuvastatin (CRESTOR) 10 MG tablet Take 10 mg by mouth daily.   Yes Historical Provider, MD   Physical Exam: Filed Vitals:   08/15/15 1130 08/15/15 1145 08/15/15 1200 08/15/15 1215  BP: 137/71  140/77   Pulse: 74 74 76 74  Temp:      TempSrc:      Resp:      SpO2: 100% 96% 96% 97%   eomi edentulous-on exam he states that when he looks straight up at the peripheries of his upper vision he feels dizzy No jvd No bruit s1s2no m/r/g no displaced PMI Chest clear Finger nose finger a little unsteady but normal Abd slight tender LUQ No asterixis No LE rash   Labs on Admission:  Basic Metabolic Panel:  Recent Labs Lab 08/15/15 0920  NA 128*  K 4.3  CL 92*  CO2 22  GLUCOSE 105*  BUN 8  CREATININE 1.02  CALCIUM 8.7*   Liver Function Tests:  Recent Labs Lab 08/15/15 0920  AST 29  ALT 16*  ALKPHOS 74  BILITOT 0.8  PROT 7.8  ALBUMIN 4.3   No results for input(s): LIPASE, AMYLASE in the last 168 hours. No results for input(s): AMMONIA in the last 168 hours. CBC:  Recent Labs Lab 08/15/15 0920  WBC 5.0  NEUTROABS 2.9  HGB 13.4  HCT 39.3  MCV 71.1*  PLT 206   Cardiac Enzymes: No results for input(s): CKTOTAL, CKMB, CKMBINDEX, TROPONINI in the last 168 hours.  BNP (last 3 results) No results for input(s): BNP in the last 8760 hours.  ProBNP (last 3 results) No results for input(s): PROBNP in the last 8760 hours.  CBG: No results for input(s): GLUCAP in the last 168 hours.  Radiological Exams on Admission: Dg Chest 2 View  08/15/2015  CLINICAL DATA:  Fall.  History of stroke. EXAM: CHEST  2 VIEW COMPARISON:  02/10/2011 FINDINGS: The heart size and mediastinal contours are within normal limits. Both lungs are clear. The visualized  skeletal structures are unremarkable. IMPRESSION: No active cardiopulmonary disease. Electronically Signed   By: Nolon Nations M.D.   On: 08/15/2015 10:04   Dg Lumbar Spine Complete  08/15/2015  CLINICAL DATA:  Severe bilateral low back pain after multiple falls. EXAM: LUMBAR SPINE - COMPLETE 4+ VIEW COMPARISON:  None. FINDINGS: Mildly displaced fracture of the right twelfth rib is noted posteriorly of indeterminate age. No fracture or spondylolisthesis is seen involving the lumbar spine. Disc spaces are well-maintained. Anterior osteophyte formation is noted at L1-2, L2-3 and L3-4. Hypertrophy of posterior facet joints is seen at L5-S1 secondary to degenerative joint disease. IMPRESSION: Mild degenerative changes is seen involving the lumbar spine. There is no evidence of fracture or spondylolisthesis involving the lumbar spine. Mildly displaced right twelfth rib fracture is noted of indeterminate age. Electronically Signed   By: Marijo Conception, M.D.   On: 08/15/2015 10:05   Dg Pelvis 1-2 Views  08/15/2015  CLINICAL DATA:  Fall.  Back pain. EXAM: PELVIS - 1-2 VIEW COMPARISON:  None. FINDINGS: There is no evidence of pelvic fracture or diastasis. No pelvic bone lesions are seen. IMPRESSION: Negative. Electronically Signed   By: Nolon Nations M.D.   On: 08/15/2015 10:06   Ct Head Wo Contrast  08/15/2015  CLINICAL DATA:  Fall 2 days ago. Repeat fall yesterday. History of stroke 4 years ago. EXAM: CT HEAD WITHOUT CONTRAST TECHNIQUE: Contiguous axial images were obtained from the base of the skull through the vertex without intravenous contrast. COMPARISON:  02/10/2011 FINDINGS: Sinuses/Soft tissues: Minimal mucosal thickening of the sphenoid sinuses, ethmoid air cells. No significant soft tissue swelling. No skull fracture. Clear mastoid air cells. Intracranial: Moderate low density in the periventricular white matter likely related to small vessel disease. No mass lesion, hemorrhage, hydrocephalus, acute  infarct, intra-axial, or extra-axial fluid collection. IMPRESSION: 1.  No acute intracranial abnormality. 2. Sinus disease. 3. Small vessel ischemic change. Electronically Signed   By: Abigail Miyamoto M.D.   On: 08/15/2015 09:38     Syncope DDX BPPV versus hyponatremia versus infarct Unclear etiology but may be related to cerebral infarct versus hyponatremia which can also cause dizziness. I suspect it is the latter. MRI brain pending obbs on telemetry Obtain orthostatics Would not pursue full stroke workup unless MRI is positive See below  Drinker chronic ethanol habituation Watch CIWA for signs of withdrawal and may need Ativan I suspect he underplays his drinking  Hypertension Continue Coreg 12.5 twice a day as well as lisinopril 20 Monitor blood pressure and orthostatics  Prior CVA 2012  Will need aspirin 325 but careful as patient has history of falls On discharge his continue omega-3 fatty acids  Hodgkin lymphoma Patient's family seems apprised when I mention this and I'm not sure if they are aware of this Will need outpatient screening for this   Full code confirmed at bedside Observation status TELEMETRY Updated wife at bedside [place] will be able to be discharged within 24-48 hours   Verlon Au [facility] Pager [phone]  If 7PM-7AM, please contact night-coverage [email or web address] Password [email or web address] 08/15/2015, 1:01 PM

## 2015-08-16 ENCOUNTER — Observation Stay (HOSPITAL_BASED_OUTPATIENT_CLINIC_OR_DEPARTMENT_OTHER): Payer: Medicare HMO

## 2015-08-16 DIAGNOSIS — R55 Syncope and collapse: Secondary | ICD-10-CM | POA: Diagnosis not present

## 2015-08-16 DIAGNOSIS — R0600 Dyspnea, unspecified: Secondary | ICD-10-CM

## 2015-08-16 LAB — CBC
HCT: 40 % (ref 39.0–52.0)
HEMOGLOBIN: 13.3 g/dL (ref 13.0–17.0)
MCH: 24.3 pg — AB (ref 26.0–34.0)
MCHC: 33.3 g/dL (ref 30.0–36.0)
MCV: 73.1 fL — ABNORMAL LOW (ref 78.0–100.0)
PLATELETS: 221 10*3/uL (ref 150–400)
RBC: 5.47 MIL/uL (ref 4.22–5.81)
RDW: 14.8 % (ref 11.5–15.5)
WBC: 3.7 10*3/uL — AB (ref 4.0–10.5)

## 2015-08-16 LAB — BASIC METABOLIC PANEL
ANION GAP: 11 (ref 5–15)
BUN: 11 mg/dL (ref 6–20)
CHLORIDE: 94 mmol/L — AB (ref 101–111)
CO2: 26 mmol/L (ref 22–32)
Calcium: 8.8 mg/dL — ABNORMAL LOW (ref 8.9–10.3)
Creatinine, Ser: 1.5 mg/dL — ABNORMAL HIGH (ref 0.61–1.24)
GFR, EST AFRICAN AMERICAN: 52 mL/min — AB (ref >60)
GFR, EST NON AFRICAN AMERICAN: 45 mL/min — AB (ref >60)
Glucose, Bld: 146 mg/dL — ABNORMAL HIGH (ref 65–99)
POTASSIUM: 3.8 mmol/L (ref 3.5–5.1)
SODIUM: 131 mmol/L — AB (ref 135–145)

## 2015-08-16 LAB — LIPID PANEL
Cholesterol: 151 mg/dL (ref 0–200)
HDL: 81 mg/dL (ref >40)
LDL CALC: 52 mg/dL (ref 0–99)
TRIGLYCERIDES: 91 mg/dL (ref <150)
Total CHOL/HDL Ratio: 1.9 RATIO
VLDL: 18 mg/dL (ref 0–40)

## 2015-08-16 MED ORDER — VITAMIN B-1 100 MG PO TABS
100.0000 mg | ORAL_TABLET | Freq: Every day | ORAL | Status: DC
  Administered 2015-08-17 – 2015-08-20 (×4): 100 mg via ORAL
  Filled 2015-08-16 (×5): qty 1

## 2015-08-16 MED ORDER — ADULT MULTIVITAMIN W/MINERALS CH
1.0000 | ORAL_TABLET | Freq: Every day | ORAL | Status: DC
  Filled 2015-08-16: qty 1

## 2015-08-16 MED ORDER — ACETAMINOPHEN 325 MG PO TABS: 650.0000 mg | ORAL_TABLET | Freq: Four times a day (QID) | ORAL | Status: DC | PRN

## 2015-08-16 MED ORDER — LORAZEPAM 2 MG/ML IJ SOLN
0.0000 mg | Freq: Two times a day (BID) | INTRAMUSCULAR | Status: DC
  Administered 2015-08-18: 2 mg via INTRAVENOUS
  Filled 2015-08-16: qty 1

## 2015-08-16 MED ORDER — LORAZEPAM 1 MG PO TABS: 1.0000 mg | ORAL_TABLET | Freq: Four times a day (QID) | ORAL | Status: AC | PRN

## 2015-08-16 MED ORDER — LORAZEPAM 2 MG/ML IJ SOLN
INTRAMUSCULAR | Status: AC
  Filled 2015-08-16: qty 1

## 2015-08-16 MED ORDER — SODIUM CHLORIDE 0.9 % IV SOLN
INTRAVENOUS | Status: DC
  Administered 2015-08-16 – 2015-08-20 (×5): via INTRAVENOUS

## 2015-08-16 MED ORDER — FOLIC ACID 1 MG PO TABS
1.0000 mg | ORAL_TABLET | Freq: Every day | ORAL | Status: DC
  Administered 2015-08-17 – 2015-08-20 (×4): 1 mg via ORAL
  Filled 2015-08-16 (×5): qty 1

## 2015-08-16 MED ORDER — LORAZEPAM 2 MG/ML IJ SOLN
0.0000 mg | Freq: Four times a day (QID) | INTRAMUSCULAR | Status: AC
  Administered 2015-08-16: 2 mg via INTRAVENOUS
  Administered 2015-08-16 – 2015-08-17 (×2): 4 mg via INTRAVENOUS
  Administered 2015-08-17 – 2015-08-18 (×3): 2 mg via INTRAVENOUS
  Filled 2015-08-16: qty 1
  Filled 2015-08-16: qty 2
  Filled 2015-08-16 (×5): qty 1

## 2015-08-16 MED ORDER — LORAZEPAM 2 MG/ML IJ SOLN
1.0000 mg | Freq: Four times a day (QID) | INTRAMUSCULAR | Status: AC | PRN
  Administered 2015-08-17 – 2015-08-18 (×4): 1 mg via INTRAVENOUS
  Filled 2015-08-16 (×4): qty 1

## 2015-08-16 MED ORDER — THIAMINE HCL 100 MG/ML IJ SOLN
100.0000 mg | Freq: Every day | INTRAMUSCULAR | Status: DC
  Filled 2015-08-16 (×3): qty 1

## 2015-08-16 NOTE — Care Management Note (Signed)
Case Management Note  Patient Details  Name: Derek Stevens MRN: [id number] Date of Birth: 1940/11/06  Subjective/Objective:74 y/o m admitted w/syncope. PT-[facility] HH.[place] chosen by spouse.TC [facility] rep Santiago Glad aware of [place] orders.                    Action/Plan:d/c home w/HHC.   Expected Discharge Date:                 Expected Discharge Plan:  [place]  In-House Referral:     Discharge planning Services  CM Consult  Post Acute Care Choice:    Choice offered to:  Spouse  DME Arranged:    DME Agency:     HH Arranged:  PT, OT HH Agency:  [place]  Status of Service:  In process, will continue to follow  Medicare Important Message Given:    Date Medicare IM Given:    Medicare IM give by:    Date Additional Medicare IM Given:    Additional Medicare Important Message give by:     If discussed at [place] of Stay Meetings, dates discussed:    Additional Comments:  Dessa Phi, RN 08/16/2015, 2:48 PM

## 2015-08-16 NOTE — Progress Notes (Signed)
Patient becoming increasingly agitated/confused.  Patient continuing to display increasing withdrawal symptoms.  Dr. Broadus John notified, with orders received.

## 2015-08-16 NOTE — Evaluation (Signed)
Occupational Therapy Evaluation Patient Details Name: Derek Stevens MRN: [id number] DOB: 1940/09/25 Today's Date: 08/16/2015    History of Present Illness Pt is a 75 year old male with PMHx of Hodgkin lymphoma 2A diag 1994, CVA, hypertension, alcohol abuse and admitted after fall and possible syncopal episode.  Lab work reveals hyponatremia. MRI: no acute findings. L spine xray negative for fx.   Clinical Impression   Pt was admitted for the above. Pt recalls episodes of falls but he does not know cause.  He requires mostly min guard for adls (and min A for tub transfer) at this time.  Pt did have decreased attention in conversation and decreased safety awareness and decreased awareness of deficits.  Will benefit from continued OT in acute and post acute.  Acute goals are for supervision to min guard A    Follow Up Recommendations  Home health OT;Supervision/Assistance - 24 hour    Equipment Recommendations   (pt to consider tub bench)    Recommendations for Other Services       Precautions / Restrictions Precautions Precautions: Fall Restrictions Weight Bearing Restrictions: No      Mobility Bed Mobility Overal bed mobility: Needs Assistance Bed Mobility: Supine to Sit;Sit to Supine     Supine to sit: Supervision Sit to supine: Supervision   General bed mobility comments: recommended sidelying for minimizing stress on back; did not follow my cues--demonstrated for him afterwards on window seat  Transfers Overall transfer level: Needs assistance Equipment used: None Transfers: Sit to/from Stand Sit to Stand: Min guard         General transfer comment: min/guard for safety    Balance Overall balance assessment: History of Falls                                          ADL Overall ADL's : Needs assistance/impaired     Grooming: Supervision/safety;Standing   Upper Body Bathing: Set up;Sitting   Lower Body Bathing: Min guard;Sit  to/from stand   Upper Body Dressing : Set up;Sitting   Lower Body Dressing: Min guard;Sit to/from stand   Toilet Transfer: Min guard;Ambulation       Tub/ Shower Transfer: Tub transfer;Minimal assistance;Ambulation (simulated)     General ADL Comments: pt needs set up for UB adls and min guard for standing for LB adls.  Took a couple of tries for him to stand from bed.  Educated on tub seat vs bench. Recommend tub bench for safety.  Wife is familiar with these.  If he does not get DME, recommended that he sponge bathe for safety     Vision     Perception     Praxis      Pertinent Vitals/Pain Pain Assessment: Faces Faces Pain Scale: Hurts little more Pain Location: Back Pain Descriptors / Indicators: Sore Pain Intervention(s): Limited activity within patient's tolerance;Monitored during session;Repositioned     Hand Dominance     Extremity/Trunk Assessment Upper Extremity Assessment Upper Extremity Assessment: Generalized weakness      Cervical / Trunk Assessment Cervical / Trunk Assessment: Normal   Communication Communication Communication: No difficulties   Cognition Arousal/Alertness: Awake/alert Behavior During Therapy: WFL for tasks assessed/performed Overall Cognitive Status: Impaired/Different from baseline Area of Impairment: Attention;Safety/judgement               General Comments: Wife present; unsure if cognition is at baseline for him  or worse.  Pt kept returning to previous topic of conversation; tangential at times and hard to follow.  Pt indicated that a towel with handles to wash his back would keep him steadier in shower.    Pt with decreased safety awareness and decreased awareness of deficits.  Wife reports that he gets mad if she says he is unsteady   General Comments       Exercises       Shoulder Instructions      Home Living Family/patient expects to be discharged to:: Private residence Living Arrangements: Spouse/significant  other Available Help at Discharge: Family;Available 24 hours/day Type of Home: House Home Access: Stairs to enter [employer] of Steps: 2   Home Layout: One level     Bathroom Shower/Tub: Tub/shower unit Shower/tub characteristics: Architectural technologist: Standard     Home Equipment: Cane - single point          Prior Functioning/Environment Level of Independence: Independent        Comments: uses cane outside    OT Diagnosis: Generalized weakness;Cognitive deficits;Acute pain   OT Problem List: Decreased strength;Decreased activity tolerance;Impaired balance (sitting and/or standing);Decreased cognition;Decreased knowledge of use of DME or AE;Decreased safety awareness;Pain   OT Treatment/Interventions: Self-care/ADL training;DME and/or AE instruction;Patient/family education;Balance training;Therapeutic activities;Cognitive remediation/compensation    OT Goals(Current goals can be found in the care plan section) Acute Rehab OT Goals Patient Stated Goal: home OT Goal Formulation: With patient/family Time For Goal Achievement: 08/23/15 Potential to Achieve Goals: Good ADL Goals Pt Will Transfer to Toilet: with supervision;ambulating;regular height toilet Pt Will Perform Tub/Shower Transfer: Tub transfer;tub bench;with min guard assist;ambulating Additional ADL Goal #1: pt will perform adl with set up, sit to stand Additional ADL Goal #2: pt will demonstrate good safety by calling for assistance for needs and not attempting to get OOB with alarm on  OT Frequency: Min 2X/week   Barriers to D/C:            Co-evaluation              End of Session    Activity Tolerance: Patient tolerated treatment well Patient left: in bed;with call bell/phone within reach;with bed alarm set;with family/visitor present   Time: [id number] OT Time Calculation (min): 27 min Charges:  OT General Charges $OT Visit: 1 Procedure OT Evaluation $Initial OT Evaluation  Tier I: 1 Procedure G-Codes: OT G-codes **NOT FOR INPATIENT CLASS** Functional Assessment Tool Used: clinical observation and judgment Functional Limitation: Self care Self Care Current Status [id number]): At least 1 percent but less than 20 percent impaired, limited or restricted Self Care Goal Status [id number]): At least 1 percent but less than 20 percent impaired, limited or restricted  Derek Stevens 08/16/2015, 1:53 PM  Lesle Chris, OTR/L [phone] 08/16/2015

## 2015-08-16 NOTE — Progress Notes (Signed)
[facility] PROGRESS NOTE  [name] [id number] DOB: [birth year] DOA: 08/15/2015 PCP: Ron Parker, MD  Assessment/Plan: 1. Syncope -due to ? Dehydration, ? ETOH intoxication -check ECHO, Orthostatics borderline positive -IVF today -MRI negative -ambulate, PT/OT  2. ETOH abuse -counseled, no over withdrawal -thiamine, CIWA  3. HTN -continue coreg, hold ACE  4. Prior CVA -ASA at discharge  5. H/o Hodgkin's lymphoma in 1994, s/p XRT and chemo  DVT proph: lovenox  Code Status: Full Code Family Communication: none at bedside, called and d/w wife Disposition Plan: home tomorrow     HPI/Subjective: Mild back aches  Objective: Filed Vitals:   08/16/15 0432 08/16/15 1347  BP: 101/80 144/71  Pulse: 64 59  Temp: 98.2 F (36.8 C) 98.7 F (37.1 C)  Resp: 18 18    Intake/Output Summary (Last 24 hours) at 08/16/15 1353 Last data filed at 08/16/15 0700  Gross per 24 hour  Intake 833.33 ml  Output    200 ml  Net 633.33 ml   Filed Weights   08/15/15 1600  Weight: 63.504 kg (140 lb)    Exam:   General:  AAOx3  Cardiovascular: S1S2/RRR  Respiratory: CTAB  Abdomen: soft, NT, BS present  Musculoskeletal: no edema c/c   Data Reviewed: Basic Metabolic Panel:  Recent Labs Lab 08/15/15 0920 08/16/15 1000  NA 128* 131*  K 4.3 3.8  CL 92* 94*  CO2 22 26  GLUCOSE 105* 146*  BUN 8 11  CREATININE 1.02 1.50*  CALCIUM 8.7* 8.8*   Liver Function Tests:  Recent Labs Lab 08/15/15 0920  AST 29  ALT 16*  ALKPHOS 74  BILITOT 0.8  PROT 7.8  ALBUMIN 4.3    Recent Labs Lab 08/15/15 1830  LIPASE 45   No results for input(s): AMMONIA in the last 168 hours. CBC:  Recent Labs Lab 08/15/15 0920 08/16/15 1000  WBC 5.0 3.7*  NEUTROABS 2.9  --   HGB 13.4 13.3  HCT 39.3 40.0  MCV 71.1* 73.1*  PLT 206 221   Cardiac Enzymes: No results for input(s): CKTOTAL, CKMB, CKMBINDEX, TROPONINI in the last 168 hours. BNP (last 3  results) No results for input(s): BNP in the last 8760 hours.  ProBNP (last 3 results) No results for input(s): PROBNP in the last 8760 hours.  CBG: No results for input(s): GLUCAP in the last 168 hours.  No results found for this or any previous visit (from the past 240 hour(s)).   Studies: Dg Chest 2 View  08/15/2015  CLINICAL DATA:  Fall.  History of stroke. EXAM: CHEST  2 VIEW COMPARISON:  02/10/2011 FINDINGS: The heart size and mediastinal contours are within normal limits. Both lungs are clear. The visualized skeletal structures are unremarkable. IMPRESSION: No active cardiopulmonary disease. Electronically Signed   By: Nolon Nations M.D.   On: 08/15/2015 10:04   Dg Lumbar Spine Complete  08/15/2015  CLINICAL DATA:  Severe bilateral low back pain after multiple falls. EXAM: LUMBAR SPINE - COMPLETE 4+ VIEW COMPARISON:  None. FINDINGS: Mildly displaced fracture of the right twelfth rib is noted posteriorly of indeterminate age. No fracture or spondylolisthesis is seen involving the lumbar spine. Disc spaces are well-maintained. Anterior osteophyte formation is noted at L1-2, L2-3 and L3-4. Hypertrophy of posterior facet joints is seen at L5-S1 secondary to degenerative joint disease. IMPRESSION: Mild degenerative changes is seen involving the lumbar spine. There is no evidence of fracture or spondylolisthesis involving the lumbar spine. Mildly displaced right twelfth rib fracture is noted  of indeterminate age. Electronically Signed   By: Marijo Conception, M.D.   On: 08/15/2015 10:05   Dg Pelvis 1-2 Views  08/15/2015  CLINICAL DATA:  Fall.  Back pain. EXAM: PELVIS - 1-2 VIEW COMPARISON:  None. FINDINGS: There is no evidence of pelvic fracture or diastasis. No pelvic bone lesions are seen. IMPRESSION: Negative. Electronically Signed   By: Nolon Nations M.D.   On: 08/15/2015 10:06   Ct Head Wo Contrast  08/15/2015  CLINICAL DATA:  Fall 2 days ago. Repeat fall yesterday. History of stroke 4  years ago. EXAM: CT HEAD WITHOUT CONTRAST TECHNIQUE: Contiguous axial images were obtained from the base of the skull through the vertex without intravenous contrast. COMPARISON:  02/10/2011 FINDINGS: Sinuses/Soft tissues: Minimal mucosal thickening of the sphenoid sinuses, ethmoid air cells. No significant soft tissue swelling. No skull fracture. Clear mastoid air cells. Intracranial: Moderate low density in the periventricular white matter likely related to small vessel disease. No mass lesion, hemorrhage, hydrocephalus, acute infarct, intra-axial, or extra-axial fluid collection. IMPRESSION: 1.  No acute intracranial abnormality. 2. Sinus disease. 3. Small vessel ischemic change. Electronically Signed   By: Abigail Miyamoto M.D.   On: 08/15/2015 09:38   Mr Brain Wo Contrast  08/15/2015  CLINICAL DATA:  Recurrent syncopal episodes with dizziness. Head trauma is reported occurring with at least one of these episodes. Stroke risk factors include hypertension and previous cerebral infarction. Remote history of lymphoma. EXAM: MRI HEAD WITHOUT CONTRAST TECHNIQUE: Multiplanar, multiecho pulse sequences of the brain and surrounding structures were obtained without intravenous contrast. COMPARISON:  MRI brain 12/23/2010.  CT head earlier today. FINDINGS: No evidence for acute infarction, acute hemorrhage, mass lesion, hydrocephalus, or extra-axial fluid. Global atrophy. Moderately advanced chronic microvascular ischemic change affects the periventricular and subcortical white matter. Atrophy and small vessel disease are progressive since 2012. There is a moderate-sized remote infarct affecting the LEFT basal ganglia. A smaller area of chronic infarction affects the RIGHT inferior vermis. This is related to what was probably an acute RIGHT vertebral occlusion in 2012. Flow voids are maintained on today's study in the BILATERAL carotid, LEFT vertebral, and basilar arteries. No flow related enhancement is seen in the RIGHT  vertebral. 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 Cerebral amyloid angiopathy is less favored. No midline abnormality. Cervical spondylosis. Negative orbits. Mild chronic sinus disease. No mastoid fluid. Extracranial soft tissues unremarkable. No visible extracranial lymphoid enlargement. IMPRESSION: Global atrophy with small vessel disease, progressive since 2012. No acute intracranial findings of ischemia or posttraumatic sequelae. 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 Chronic occlusion RIGHT vertebral artery. Electronically Signed   By: Staci Righter M.D.   On: 08/15/2015 20:05    Scheduled Meds: . aspirin  325 mg Oral Daily  . carvedilol  12.5 mg Oral BID WC  . enoxaparin (LOVENOX) injection  40 mg Subcutaneous Daily  . feeding supplement (ENSURE ENLIVE)  237 mL Oral BID BM  . lisinopril  20 mg Oral Daily  . multivitamin with minerals  1 tablet Oral Daily  . thiamine  100 mg Intramuscular Once  . thiamine  100 mg Oral Daily   Continuous Infusions: . sodium chloride     Antibiotics Given (last 72 hours)    None      Active Problems:   Syncope   Syncopal episodes  Time spent: 14min    Derek Stevens  [facility] Pager [phone]. If 7PM-7AM, please contact night-coverage at [email or web address], password [facility] 08/16/2015, 1:53 PM  LOS: 1 day

## 2015-08-16 NOTE — Evaluation (Signed)
Physical Therapy Evaluation Patient Details Name: INDIO SAMP MRN: [id number] DOB: 29-Jun-1941 Today's Date: 08/16/2015   History of Present Illness  Pt is a 75 year old male with PMHx of Hodgkin lymphoma 2A diag 1994, CVA, hypertension, alcohol abuse and admitted after fall and possible syncopal episode.  Lab work reveals hyponatremia. MRI: no acute findings. L spine xray negative for fx.  Clinical Impression  Pt admitted with above diagnosis. Pt currently with functional limitations due to the deficits listed below (see PT Problem List).  Pt will benefit from skilled PT to increase their independence and safety with mobility to allow discharge to the venue listed below.   Pt reports a few falls prior to admission.  He states he does not know why he falls.  Pt denies dizziness with mobility. Pregait BP 120/72 mmHg HR 71 and postgait BP 137/75 mmHg, HR 66, and SpO2 100% room air.  Pt encouraged to mobilize with family present and use SPC upon d/c for safety.     Follow Up Recommendations Home health PT;Supervision for mobility/OOB    Equipment Recommendations  None recommended by PT    Recommendations for Other Services       Precautions / Restrictions Precautions Precautions: Fall      Mobility  Bed Mobility               General bed mobility comments: pt up in recliner on arrival  Transfers Overall transfer level: Needs assistance Equipment used: None Transfers: Sit to/from Stand Sit to Stand: Min guard         General transfer comment: min/guard for safety  Ambulation/Gait Ambulation/Gait assistance: Min guard Ambulation Distance (Feet): 100 Feet Assistive device: None Gait Pattern/deviations: Step-to pattern;Antalgic;Decreased stance time - right Gait velocity: decreased   General Gait Details: observed short steps with increased R LE external rotation, pt reports no hx of surgery, pt reports no dizziness but c/o weakness and cold feeling in LEs, HR  69-72 bpm during gait  Stairs            Wheelchair Mobility    Modified Rankin (Stroke Patients Only)       Balance Overall balance assessment: History of Falls                                           Pertinent Vitals/Pain Pain Assessment: 0-10 (did not rate but reports tolerable) Pain Location: reports back pain Pain Descriptors / Indicators: Sore Pain Intervention(s): Limited activity within patient's tolerance;Monitored during session    Home Living Family/patient expects to be discharged to:: Private residence Living Arrangements: Spouse/significant other Available Help at Discharge: Family;Available 24 hours/day Type of Home: House Home Access: Stairs to enter   [employer] of Steps: 2 Home Layout: One level Home Equipment: Cane - single point      Prior Function Level of Independence: Independent         Comments: uses cane outside     Hand Dominance        Extremity/Trunk Assessment               Lower Extremity Assessment: Overall WFL for tasks assessed      Cervical / Trunk Assessment: Normal  Communication   Communication: No difficulties  Cognition Arousal/Alertness: Awake/alert Behavior During Therapy: WFL for tasks assessed/performed Overall Cognitive Status: Within Functional Limits for tasks assessed  General Comments      Exercises        Assessment/Plan    PT Assessment Patient needs continued PT services  PT Diagnosis Difficulty walking   PT Problem List Decreased activity tolerance;Decreased mobility;Decreased balance;Decreased knowledge of use of DME  PT Treatment Interventions DME instruction;Gait training;Functional mobility training;Patient/family education;Stair training;Therapeutic activities;Therapeutic exercise;Balance training   PT Goals (Current goals can be found in the Care Plan section) Acute Rehab PT Goals PT Goal Formulation: With  patient Time For Goal Achievement: 08/23/15 Potential to Achieve Goals: Good    Frequency Min 3X/week   Barriers to discharge        Co-evaluation               End of Session Equipment Utilized During Treatment: Gait belt Activity Tolerance: Patient limited by fatigue Patient left: in chair;with call bell/phone within reach;with chair alarm set      Functional Assessment Tool Used: clinical judgement Functional Limitation: Mobility: Walking and moving around Mobility: Walking and Moving Around Current Status [phone]): At least 1 percent but less than 20 percent impaired, limited or restricted Mobility: Walking and Moving Around Goal Status [phone]): 0 percent impaired, limited or restricted    Time: 1024-1039 PT Time Calculation (min) (ACUTE ONLY): 15 min   Charges:   PT Evaluation $Initial PT Evaluation Tier I: 1 Procedure     PT G Codes:   PT G-Codes **NOT FOR INPATIENT CLASS** Functional Assessment Tool Used: clinical judgement Functional Limitation: Mobility: Walking and moving around Mobility: Walking and Moving Around Current Status [phone]): At least 1 percent but less than 20 percent impaired, limited or restricted Mobility: Walking and Moving Around Goal Status [phone]): 0 percent impaired, limited or restricted    Tatumn Corbridge,KATHrine E 08/16/2015, 12:17 PM Carmelia Bake, PT, DPT 08/16/2015 Pager: [phone]

## 2015-08-16 NOTE — Progress Notes (Signed)
  Echocardiogram 2D Echocardiogram has been performed.  Diamond Nickel 08/16/2015, 12:15 PM

## 2015-08-17 DIAGNOSIS — E785 Hyperlipidemia, unspecified: Secondary | ICD-10-CM | POA: Diagnosis present

## 2015-08-17 DIAGNOSIS — E038 Other specified hypothyroidism: Secondary | ICD-10-CM | POA: Diagnosis present

## 2015-08-17 DIAGNOSIS — F1010 Alcohol abuse, uncomplicated: Secondary | ICD-10-CM | POA: Diagnosis present

## 2015-08-17 DIAGNOSIS — N179 Acute kidney failure, unspecified: Secondary | ICD-10-CM | POA: Diagnosis present

## 2015-08-17 DIAGNOSIS — Z7982 Long term (current) use of aspirin: Secondary | ICD-10-CM | POA: Diagnosis not present

## 2015-08-17 DIAGNOSIS — Z9221 Personal history of antineoplastic chemotherapy: Secondary | ICD-10-CM | POA: Diagnosis not present

## 2015-08-17 DIAGNOSIS — E860 Dehydration: Secondary | ICD-10-CM | POA: Diagnosis present

## 2015-08-17 DIAGNOSIS — F10231 Alcohol dependence with withdrawal delirium: Secondary | ICD-10-CM | POA: Diagnosis not present

## 2015-08-17 DIAGNOSIS — J45909 Unspecified asthma, uncomplicated: Secondary | ICD-10-CM | POA: Diagnosis present

## 2015-08-17 DIAGNOSIS — Z801 Family history of malignant neoplasm of trachea, bronchus and lung: Secondary | ICD-10-CM | POA: Diagnosis not present

## 2015-08-17 DIAGNOSIS — R296 Repeated falls: Secondary | ICD-10-CM | POA: Diagnosis present

## 2015-08-17 DIAGNOSIS — E871 Hypo-osmolality and hyponatremia: Secondary | ICD-10-CM | POA: Diagnosis present

## 2015-08-17 DIAGNOSIS — Z87891 Personal history of nicotine dependence: Secondary | ICD-10-CM | POA: Diagnosis not present

## 2015-08-17 DIAGNOSIS — Z923 Personal history of irradiation: Secondary | ICD-10-CM | POA: Diagnosis not present

## 2015-08-17 DIAGNOSIS — M545 Low back pain: Secondary | ICD-10-CM | POA: Diagnosis present

## 2015-08-17 DIAGNOSIS — F10931 Alcohol use, unspecified with withdrawal delirium: Secondary | ICD-10-CM | POA: Diagnosis present

## 2015-08-17 DIAGNOSIS — Y92009 Unspecified place in unspecified non-institutional (private) residence as the place of occurrence of the external cause: Secondary | ICD-10-CM | POA: Diagnosis not present

## 2015-08-17 DIAGNOSIS — I10 Essential (primary) hypertension: Secondary | ICD-10-CM | POA: Diagnosis not present

## 2015-08-17 DIAGNOSIS — K219 Gastro-esophageal reflux disease without esophagitis: Secondary | ICD-10-CM | POA: Diagnosis present

## 2015-08-17 DIAGNOSIS — Z8571 Personal history of Hodgkin lymphoma: Secondary | ICD-10-CM | POA: Diagnosis not present

## 2015-08-17 DIAGNOSIS — R55 Syncope and collapse: Secondary | ICD-10-CM | POA: Diagnosis not present

## 2015-08-17 DIAGNOSIS — Z79899 Other long term (current) drug therapy: Secondary | ICD-10-CM | POA: Diagnosis not present

## 2015-08-17 DIAGNOSIS — W1839XA Other fall on same level, initial encounter: Secondary | ICD-10-CM | POA: Diagnosis present

## 2015-08-17 DIAGNOSIS — G9340 Encephalopathy, unspecified: Secondary | ICD-10-CM | POA: Diagnosis not present

## 2015-08-17 DIAGNOSIS — Z8673 Personal history of transient ischemic attack (TIA), and cerebral infarction without residual deficits: Secondary | ICD-10-CM | POA: Diagnosis not present

## 2015-08-17 LAB — HEMOGLOBIN A1C
Hgb A1c MFr Bld: 6 % — ABNORMAL HIGH (ref 4.8–5.6)
Mean Plasma Glucose: 126 mg/dL

## 2015-08-17 LAB — BASIC METABOLIC PANEL
ANION GAP: 9 (ref 5–15)
BUN: 7 mg/dL (ref 6–20)
CALCIUM: 8.7 mg/dL — AB (ref 8.9–10.3)
CO2: 26 mmol/L (ref 22–32)
CREATININE: 0.96 mg/dL (ref 0.61–1.24)
Chloride: 101 mmol/L (ref 101–111)
GLUCOSE: 111 mg/dL — AB (ref 65–99)
Potassium: 4.3 mmol/L (ref 3.5–5.1)
Sodium: 136 mmol/L (ref 135–145)

## 2015-08-17 MED ORDER — LORAZEPAM 2 MG/ML IJ SOLN
1.0000 mg | Freq: Once | INTRAMUSCULAR | Status: AC
  Administered 2015-08-17: 1 mg via INTRAVENOUS
  Filled 2015-08-17: qty 1

## 2015-08-17 MED ORDER — HYDRALAZINE HCL 20 MG/ML IJ SOLN
5.0000 mg | INTRAMUSCULAR | Status: DC | PRN
  Administered 2015-08-17 – 2015-08-18 (×2): 5 mg via INTRAVENOUS
  Filled 2015-08-17 (×2): qty 1

## 2015-08-17 NOTE — Progress Notes (Addendum)
[facility] PROGRESS NOTE  IYAN MARTINES [id number] DOB: [birth year] DOA: 08/15/2015 PCP: Ron Parker, MD74/M with h/o CVA, remote h/o lymphoma, ETOH  Abuse admitted after frequent falls, apparently one of the episodes had lost consciousness . Admitted for evaluation On 1/2 @ 6pm started with agitation, confusion, ETOH withdrawal  Assessment/Plan: 1. Syncope -due to ? Dehydration, ? ETOH intoxication, no seizure history obtained -ECHO with septal hypertrophy, normal EF and wall motion -Orthostatics borderline positive -continue IVF today, MRI negative -today with ETOH withdrawals  2.ETOH withdrawal/DTs -confusion, intermittent agitation, tremors since last pm -continue Ativan per CIWA protocol -thiamine, sitter, IVF  3. HTN -continue coreg, hold ACE  4. Prior CVA -continue ASA  -MRI negative  5. H/o Hodgkin's lymphoma in 1994, s/p XRT and chemo  DVT proph: lovenox  Code Status: Full Code Family Communication: none at bedside, called and d/w wife 1/2 Disposition Plan: home when stable,      HPI/Subjective: Confused, tells me its 1965, and he is at home  Objective: Filed Vitals:   08/17/15 0615 08/17/15 1441  BP: 169/84 180/94  Pulse: 56 57  Temp: 97.5 F (36.4 C) 97.6 F (36.4 C)  Resp: 16 16    Intake/Output Summary (Last 24 hours) at 08/17/15 1720 Last data filed at 08/17/15 1500  Gross per 24 hour  Intake 1738.75 ml  Output   3100 ml  Net -1361.25 ml   Filed Weights   08/15/15 1600  Weight: 63.504 kg (140 lb)    Exam:   General:  Confused, can be re-oriented at times  Cardiovascular: S1S2/RRR  Respiratory: CTAB  Abdomen: soft, NT, BS present  Musculoskeletal: no edema c/c   Neuro: fine tremors noted, moves all extremities, no localising signs  Data Reviewed: Basic Metabolic Panel:  Recent Labs Lab 08/15/15 0920 08/16/15 1000 08/17/15 0740  NA 128* 131* 136  K 4.3 3.8 4.3  CL 92* 94* 101  CO2 22 26 26   GLUCOSE  105* 146* 111*  BUN 8 11 7   CREATININE 1.02 1.50* 0.96  CALCIUM 8.7* 8.8* 8.7*   Liver Function Tests:  Recent Labs Lab 08/15/15 0920  AST 29  ALT 16*  ALKPHOS 74  BILITOT 0.8  PROT 7.8  ALBUMIN 4.3    Recent Labs Lab 08/15/15 1830  LIPASE 45   No results for input(s): AMMONIA in the last 168 hours. CBC:  Recent Labs Lab 08/15/15 0920 08/16/15 1000  WBC 5.0 3.7*  NEUTROABS 2.9  --   HGB 13.4 13.3  HCT 39.3 40.0  MCV 71.1* 73.1*  PLT 206 221   Cardiac Enzymes: No results for input(s): CKTOTAL, CKMB, CKMBINDEX, TROPONINI in the last 168 hours. BNP (last 3 results) No results for input(s): BNP in the last 8760 hours.  ProBNP (last 3 results) No results for input(s): PROBNP in the last 8760 hours.  CBG: No results for input(s): GLUCAP in the last 168 hours.  No results found for this or any previous visit (from the past 240 hour(s)).   Studies: Mr Brain Wo Contrast  08/15/2015  CLINICAL DATA:  Recurrent syncopal episodes with dizziness. Head trauma is reported occurring with at least one of these episodes. Stroke risk factors include hypertension and previous cerebral infarction. Remote history of lymphoma. EXAM: MRI HEAD WITHOUT CONTRAST TECHNIQUE: Multiplanar, multiecho pulse sequences of the brain and surrounding structures were obtained without intravenous contrast. COMPARISON:  MRI brain 12/23/2010.  CT head earlier today. FINDINGS: No evidence for acute infarction, acute hemorrhage, mass lesion, hydrocephalus,  or extra-axial fluid. Global atrophy. Moderately advanced chronic microvascular ischemic change affects the periventricular and subcortical white matter. Atrophy and small vessel disease are progressive since 2012. There is a moderate-sized remote infarct affecting the LEFT basal ganglia. A smaller area of chronic infarction affects the RIGHT inferior vermis. This is related to what was probably an acute RIGHT vertebral occlusion in 2012. Flow voids are  maintained on today's study in the BILATERAL carotid, LEFT vertebral, and basilar arteries. No flow related enhancement is seen in the RIGHT vertebral. 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 Cerebral amyloid angiopathy is less favored. No midline abnormality. Cervical spondylosis. Negative orbits. Mild chronic sinus disease. No mastoid fluid. Extracranial soft tissues unremarkable. No visible extracranial lymphoid enlargement. IMPRESSION: Global atrophy with small vessel disease, progressive since 2012. No acute intracranial findings of ischemia or posttraumatic sequelae. 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 Chronic occlusion RIGHT vertebral artery. Electronically Signed   By: Staci Righter M.D.   On: 08/15/2015 20:05    Scheduled Meds: . aspirin  325 mg Oral Daily  . carvedilol  12.5 mg Oral BID WC  . enoxaparin (LOVENOX) injection  40 mg Subcutaneous Daily  . feeding supplement (ENSURE ENLIVE)  237 mL Oral BID BM  . folic acid  1 mg Oral Daily  . LORazepam  0-4 mg Intravenous Q6H   Followed by  . [START ON 08/18/2015] LORazepam  0-4 mg Intravenous Q12H  . multivitamin with minerals  1 tablet Oral Daily  . thiamine  100 mg Intramuscular Once  . thiamine  100 mg Oral Daily   Or  . thiamine  100 mg Intravenous Daily   Continuous Infusions: . sodium chloride 75 mL/hr at 08/17/15 1126   Antibiotics Given (last 72 hours)    None      Active Problems:   Syncope   Syncopal episodes    Time spent: 79min    Hadas Jessop  [facility] Pager [phone]. If 7PM-7AM, please contact night-coverage at [email or web address], password [facility] 08/17/2015, 5:20 PM  LOS: 2 days

## 2015-08-17 NOTE — Evaluation (Signed)
SLP Cancellation Note  Patient Details Name: DORELL SONNIER MRN: [id number] DOB: 1941-04-24   Cancelled treatment:       Reason Eval/Treat Not Completed: Other (comment) (pt finally asleep per rn after being agitated, note pt without acute cva, md please cancel order if you agree)  Luanna Salk, [facility] SLP [phone]

## 2015-08-17 NOTE — Progress Notes (Signed)
Initial Nutrition Assessment  DOCUMENTATION CODES:   Not applicable  INTERVENTION:  -Ensure Enlive po BID, each supplement provides 350 kcal and 20 grams of protein  NUTRITION DIAGNOSIS:   Increased nutrient needs related to cancer and cancer related treatments as evidenced by estimated needs.  GOAL:   Patient will meet greater than or equal to 90% of their needs  MONITOR:   PO intake, I & O's, Labs, Supplement acceptance  REASON FOR ASSESSMENT:   Malnutrition Screening Tool    ASSESSMENT:   Derek Stevens is a 75 yo male w/ hx of Hodgkin's Lymphoma, CVA - 2012, ETOH Abuse, presents with syncope, etoh withdrawl symptoms, and HTN.  Attempted to speak with pt at bedside but pt very confused. Was sitting across bed perpendicular to head of bed during time of visit with restraints on hands. Called Nurse Tech to help re-orient Derek Stevens. Pt could not answer questions, refused NFPE. Per chart, pt was to d/c yesterday with home health, but with increasing confusion and ETOH withdrawal symptoms, unlikely. Awaiting MD workup-ECHO. No data on PO intake at this time.  Labs and Medications reviewed.   Diet Order:  Diet Heart Room service appropriate?: Yes; Fluid consistency:: Thin  Skin:  Reviewed, no issues  Last BM:  12/31  Height:   Ht Readings from Last 1 Encounters:  08/15/15 5\' 5"  (1.651 m)    Weight:   Wt Readings from Last 1 Encounters:  08/15/15 140 lb (63.504 kg)    Ideal Body Weight:  61.81 kg  BMI:  Body mass index is 23.3 kg/(m^2).  Estimated Nutritional Needs:   Kcal:  1900-2200  Protein:  60-85 grams  Fluid:  >/= 1.9L  EDUCATION NEEDS:   No education needs identified at this time  Satira Anis. Midas Daughety, MS, RD LDN After Hours/Weekend Pager [phone]

## 2015-08-18 DIAGNOSIS — G9340 Encephalopathy, unspecified: Secondary | ICD-10-CM

## 2015-08-18 DIAGNOSIS — I10 Essential (primary) hypertension: Secondary | ICD-10-CM

## 2015-08-18 DIAGNOSIS — F1010 Alcohol abuse, uncomplicated: Secondary | ICD-10-CM

## 2015-08-18 DIAGNOSIS — R55 Syncope and collapse: Principal | ICD-10-CM

## 2015-08-18 MED ORDER — HYDRALAZINE HCL 25 MG PO TABS
25.0000 mg | ORAL_TABLET | Freq: Three times a day (TID) | ORAL | Status: DC
  Administered 2015-08-18 – 2015-08-20 (×7): 25 mg via ORAL
  Filled 2015-08-18 (×10): qty 1

## 2015-08-18 NOTE — Progress Notes (Addendum)
Patient ID: Derek Stevens, male   DOB: 1940-10-05, 75 y.o.   MRN: [id number] [facility] PROGRESS NOTE  Derek Stevens [id number] DOB: 11-Jun-1941 DOA: 08/15/2015 PCP: Ron Parker, MD  Brief narrative:    75 year old male with past medical history of hypertension, dyslipidemia who presented to hospital status post fall at home while chopping wood. Patient is not good historian at this time as he is disoriented. MRI and CT head on the admission did not reveal acute intracranial findings.syncopal episode was felt to be secondary to possible dehydration versus alcohol withdrawal.   Assessment/Plan:    Principal problem: Acute encephalopathy / syncope - Unclear etiology, possible alcohol withdrawal versus intoxication however alcohol level was not obtained on the admission - CIWA protocol initiated on admission - No reports of withdrawals - Continue IV fluids  - Continue multivitamin, thiamine and folic acid    Active problems: Essential hypertension - Continue carvedilol 12.5 mg twice daily - Because blood pressure is 172/94 will add low-dose hydralazine  Prior CVA - Continue ASA   H/o Hodgkin's lymphoma in 1994, s/p XRT and chemo - Stable   Acute kidney injury - Secondary to dehydration - Improved with IV fluids  DVT Prophylaxis  - SCDs bilaterally  Code Status: Full.  Family Communication:  plan of care discussed with the patient's wife at the bedside this am  Disposition Plan: to SNF likely by 08/21/2015  IV access:  Peripheral IV  Procedures and diagnostic studies:    Dg Chest 2 View 08/15/2015  No active cardiopulmonary disease. Electronically Signed   By: Nolon Nations M.D.   On: 08/15/2015 10:04   Dg Lumbar Spine Complete 08/15/2015  Mild degenerative changes is seen involving the lumbar spine. There is no evidence of fracture or spondylolisthesis involving the lumbar spine. Mildly displaced right twelfth rib fracture is noted of indeterminate  age. Electronically Signed   By: Marijo Conception, M.D.   On: 08/15/2015 10:05   Dg Pelvis 1-2 Views 08/15/2015   Negative. Electronically Signed   By: Nolon Nations M.D.   On: 08/15/2015 10:06   Ct Head Wo Contrast 08/15/2015   1.  No acute intracranial abnormality. 2. Sinus disease. 3. Small vessel ischemic change. Electronically Signed   By: Abigail Miyamoto M.D.   On: 08/15/2015 09:38   Mr Brain Wo Contrast 08/15/2015 Global atrophy with small vessel disease, progressive since 2012. No acute intracranial findings of ischemia or posttraumatic sequelae. 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 Chronic occlusion RIGHT vertebral artery.   Medical Consultants:  None   Other Consultants:  PT  IAnti-Infectives:   None    Leisa Lenz, MD  [facility] Pager [phone]  Time spent in minutes: 25 minutes  If 7PM-7AM, please contact night-coverage [email or web address] Password [email or web address] 08/18/2015, 1:56 PM   LOS: 3 days    HPI/Subjective: No acute overnight events. Patient still disoriented.   Objective: Filed Vitals:   08/17/15 2106 08/18/15 0002 08/18/15 0629 08/18/15 1304  BP: 185/86 160/80  172/94  Pulse:  65 63 58  Temp:  98.1 F (36.7 C) 98.1 F (36.7 C) 97.8 F (36.6 C)  TempSrc:  Oral Oral Oral  Resp:  18  16  Height:      Weight:      SpO2:  99% 99% 100%    Intake/Output Summary (Last 24 hours) at 08/18/15 1356 Last data filed at 08/18/15 1310  Gross per 24 hour  Intake 1797.5 ml  Output   2500 ml  Net -702.5 ml    Exam:   General:  Pt is alert, not in acute distress, delirious   Cardiovascular: Regular rate and rhythm, S1/S2 (+)  Respiratory: Clear to auscultation bilaterally, no wheezing, no crackles, no rhonchi  Abdomen: Soft, non tender, non distended, bowel sounds present  Extremities: No edema, pulses DP and PT palpable bilaterally  Neuro: Grossly nonfocal  Data  Reviewed: Basic Metabolic Panel:  Recent Labs Lab 08/15/15 0920 08/16/15 1000 08/17/15 0740  NA 128* 131* 136  K 4.3 3.8 4.3  CL 92* 94* 101  CO2 22 26 26   GLUCOSE 105* 146* 111*  BUN 8 11 7   CREATININE 1.02 1.50* 0.96  CALCIUM 8.7* 8.8* 8.7*   Liver Function Tests:  Recent Labs Lab 08/15/15 0920  AST 29  ALT 16*  ALKPHOS 74  BILITOT 0.8  PROT 7.8  ALBUMIN 4.3    Recent Labs Lab 08/15/15 1830  LIPASE 45   No results for input(s): AMMONIA in the last 168 hours. CBC:  Recent Labs Lab 08/15/15 0920 08/16/15 1000  WBC 5.0 3.7*  NEUTROABS 2.9  --   HGB 13.4 13.3  HCT 39.3 40.0  MCV 71.1* 73.1*  PLT 206 221   Cardiac Enzymes: No results for input(s): CKTOTAL, CKMB, CKMBINDEX, TROPONINI in the last 168 hours. BNP: Invalid input(s): POCBNP CBG: No results for input(s): GLUCAP in the last 168 hours.  No results found for this or any previous visit (from the past 240 hour(s)).   Scheduled Meds: . aspirin  325 mg Oral Daily  . carvedilol  12.5 mg Oral BID WC  . enoxaparin (LOVENOX) injection  40 mg Subcutaneous Daily  . feeding supplement (ENSURE ENLIVE)  237 mL Oral BID BM  . folic acid  1 mg Oral Daily  . LORazepam  0-4 mg Intravenous Q6H   Followed by  . LORazepam  0-4 mg Intravenous Q12H  . multivitamin with minerals  1 tablet Oral Daily  . thiamine  100 mg Intramuscular Once  . thiamine  100 mg Oral Daily   Continuous Infusions: . sodium chloride 75 mL/hr at 08/18/15 0107

## 2015-08-18 NOTE — Clinical Social Work Placement (Signed)
   CLINICAL SOCIAL WORK PLACEMENT  NOTE  Date:  08/18/2015  Patient Details  Name: Derek Stevens MRN: [id number] Date of Birth: [birth year]  Clinical Social Work is seeking post-discharge placement for this patient at the [place] level of care (*CSW will initial, date and re-position this form in  chart as items are completed):  Yes   Patient/family provided with [place] Work Department's list of facilities offering this level of care within the geographic area requested by the patient (or if unable, by the patient's family).  Yes   Patient/family informed of their freedom to choose among providers that offer the needed level of care, that participate in Medicare, Medicaid or managed care program needed by the patient, have an available bed and are willing to accept the patient.  Yes   Patient/family informed of [facility]'s ownership interest in [facility] and [facility], as well as of the fact that they are under no obligation to receive care at these facilities.  PASRR submitted to EDS on 08/18/15     PASRR number received on 08/18/15     Existing PASRR number confirmed on       FL2 transmitted to all facilities in geographic area requested by pt/family on 08/18/15     FL2 transmitted to all facilities within larger geographic area on       Patient informed that his/her managed care company has contracts with or will negotiate with certain facilities, including the following:        Yes   Patient/family informed of bed offers received.  Patient chooses bed at       Physician recommends and patient chooses bed at      Patient to be transferred to   on  .  Patient to be transferred to facility by       Patient family notified on   of transfer.  Name of family member notified:        PHYSICIAN       Additional Comment:    _______________________________________________ Standley Brooking, LCSW 08/18/2015, 3:15 PM

## 2015-08-18 NOTE — NC FL2 (Signed)
[place] LEVEL OF CARE SCREENING TOOL     IDENTIFICATION  Patient Name: Derek Stevens Birthdate: [birth year] Sex: male Admission Date (Current Location): 08/15/2015  [facility] and [place] Number:  Herbalist and Address:  [facility],  [place] [address] [place]      Provider Number: [id number]  Attending Physician Name and Address:  Robbie Lis, MD  Relative Name and Phone Number:       Current Level of Care: Hospital Recommended Level of Care: [place] Prior Approval Number:    Date Approved/Denied:   PASRR Number:  ([id number])  Discharge Plan: SNF    Current Diagnoses: Patient Active Problem List   Diagnosis Date Noted  . Alcohol withdrawal delirium ([place]) 08/17/2015  . Alcohol abuse 08/17/2015  . Syncope 08/15/2015  . Syncopal episodes 08/15/2015    Orientation RESPIRATION BLADDER Height & Weight    Self  Normal Incontinent, External catheter 5\' 5"  (165.1 cm) 140 lbs.  BEHAVIORAL SYMPTOMS/MOOD NEUROLOGICAL BOWEL NUTRITION STATUS      Continent Diet (Heart Healthy)  AMBULATORY STATUS COMMUNICATION OF NEEDS Skin   Limited Assist Verbally Normal                       Personal Care Assistance Level of Assistance  Bathing, Feeding, Dressing Bathing Assistance: Limited assistance Feeding assistance: Limited assistance Dressing Assistance: Limited assistance     Functional Limitations [place]  PT (By licensed PT), OT (By licensed OT)     PT Frequency: 5 OT Frequency: 5            Contractures      Additional Factors Info  Code Status, Allergies Code Status Info: Full code Allergies Info: NKDA           Current Medications (08/18/2015):  This is the current hospital active medication list Current Facility-Administered Medications  Medication Dose Route Frequency Provider Last Rate Last Dose  . 0.9 %  sodium chloride infusion    Intravenous Continuous Robbie Lis, MD 75 mL/hr at 08/18/15 0107    . acetaminophen (TYLENOL) tablet 650 mg  650 mg Oral Q6H PRN Domenic Polite, MD      . aspirin tablet 325 mg  325 mg Oral Daily Nita Sells, MD   325 mg at 08/18/15 0841  . carvedilol (COREG) tablet 12.5 mg  12.5 mg Oral BID WC Nita Sells, MD   12.5 mg at 08/18/15 0840  . feeding supplement (ENSURE ENLIVE) (ENSURE ENLIVE) liquid 237 mL  237 mL Oral BID BM Nita Sells, MD   237 mL at 08/18/15 1000  . folic acid (FOLVITE) tablet 1 mg  1 mg Oral Daily Domenic Polite, MD   1 mg at 08/18/15 0841  . hydrALAZINE (APRESOLINE) injection 5 mg  5 mg Intravenous Q4H PRN Gardiner Barefoot, NP   5 mg at 08/17/15 2257  . hydrALAZINE (APRESOLINE) tablet 25 mg  25 mg Oral 3 times per day Robbie Lis, MD      . loperamide (IMODIUM) capsule 2-4 mg  2-4 mg Oral PRN Nita Sells, MD      . LORazepam (ATIVAN) injection 0-4 mg  0-4 mg Intravenous Q6H Domenic Polite, MD   2 mg at 08/18/15 1206   Followed by  . LORazepam (ATIVAN) injection 0-4 mg  0-4 mg Intravenous Q12H Domenic Polite, MD      .  LORazepam (ATIVAN) tablet 1 mg  1 mg Oral Q6H PRN Domenic Polite, MD       Or  . LORazepam (ATIVAN) injection 1 mg  1 mg Intravenous Q6H PRN Domenic Polite, MD   1 mg at 08/17/15 1548  . multivitamin with minerals tablet 1 tablet  1 tablet Oral Daily Nita Sells, MD   1 tablet at 08/16/15 0947  . ondansetron (ZOFRAN-ODT) disintegrating tablet 4 mg  4 mg Oral Q6H PRN Nita Sells, MD      . promethazine (PHENERGAN) injection 12.5 mg  12.5 mg Intravenous Q4H PRN Nita Sells, MD   12.5 mg at 08/17/15 0217  . senna-docusate (Senokot-S) tablet 1 tablet  1 tablet Oral QHS PRN Nita Sells, MD      . thiamine (B-1) injection 100 mg  100 mg Intramuscular Once Nita Sells, MD   100 mg at 08/15/15 1759  . thiamine (VITAMIN B-1) tablet 100 mg  100 mg Oral Daily Domenic Polite, MD   100 mg at  08/18/15 [id number]     Discharge Medications: Please see discharge summary for a list of discharge medications.  Relevant Imaging Results:  Relevant Lab Results:   Additional Information SSN: [id number]  Standley Brooking, LCSW

## 2015-08-18 NOTE — Progress Notes (Signed)
Physical Therapy Treatment Patient Details Name: Derek Stevens MRN: [id number] DOB: 02/17/1941 Today's Date: 08/18/2015    History of Present Illness Pt is a 75 year old male with PMHx of Hodgkin lymphoma 2A diag 1994, CVA, hypertension, alcohol abuse and admitted after fall and possible syncopal episode.  Lab work reveals hyponatremia. MRI: no acute findings. L spine xray negative for fx.    PT Comments    Pt required increase physical assistance for mobility on today. Increased difficulty ambulating. Pt continues to demonstrate decreased awareness with respect to safety and deficits. Based on today's presentation, pt will absolutely require 24 hour supervision/assist. Will continue to follow.   Follow Up Recommendations  Home health PT;Supervision/Assistance - 24 hour (may need to consider ST rehab at SNF if pt does not progress)     Equipment Recommendations  Rolling walker with 5" wheels (possibly)    Recommendations for Other Services       Precautions / Restrictions Precautions Precautions: Fall Restrictions Weight Bearing Restrictions: No    Mobility  Bed Mobility Overal bed mobility: Needs Assistance Bed Mobility: Supine to Sit;Sit to Supine     Supine to sit: Supervision Sit to supine: Supervision   General bed mobility comments: for safety  Transfers Overall transfer level: Needs assistance Equipment used: None Transfers: Sit to/from Stand Sit to Stand: Min assist         General transfer comment: Assist to rise, stabilize, control descent. Multiple attempts to successfully get to standing. Unsteady.  Ambulation/Gait Ambulation/Gait assistance: Min assist Ambulation Distance (Feet): 150 Feet Assistive device: Rolling walker (2 wheeled) Gait Pattern/deviations: Decreased step length - left;Decreased step length - right;Decreased stride length;Decreased weight shift to left;Decreased weight shift to right;Step-to pattern;Step-through pattern      General Gait Details: walked ~15 feet without device-pt was very unsteady with LOB at least once. Switched to RW use for improved stability. Pt continued to require Min assist for stability and safe RW use. Very short step and decreased lateral weightshifting noted. Multimodal cueing to encouraged increased weightshifting to allow for longer steps.   Stairs            Wheelchair Mobility    Modified Rankin (Stroke Patients Only)       Balance Overall balance assessment: Needs assistance         Standing balance support: During functional activity Standing balance-Leahy Scale: Poor                      Cognition Arousal/Alertness: Awake/alert Behavior During Therapy: WFL for tasks assessed/performed Overall Cognitive Status: Impaired/Different from baseline Area of Impairment: Attention;Safety/judgement;Following commands;Awareness;Problem solving   Current Attention Level: Selective   Following Commands: Follows one step commands inconsistently Safety/Judgement: Decreased awareness of safety;Decreased awareness of deficits   Problem Solving: Requires tactile cues;Requires verbal cues      Exercises      General Comments        Pertinent Vitals/Pain Pain Assessment: No/denies pain    Home Living                      Prior Function            PT Goals (current goals can now be found in the care plan section) Progress towards PT goals: Not progressing toward goals - comment (increased difficulty with mobility compared to last session)    Frequency  Min 3X/week    PT Plan Current plan remains appropriate  Co-evaluation             End of Session Equipment Utilized During Treatment: Gait belt Activity Tolerance: Patient limited by fatigue Patient left: in bed;with call bell/phone within reach;with bed alarm set;with family/visitor present     Time: [id number] PT Time Calculation (min) (ACUTE ONLY): 23 min  Charges:   $Gait Training: 23-37 mins                    G Codes:      Weston Anna, MPT Pager: [phone]

## 2015-08-18 NOTE — Progress Notes (Signed)
Occupational Therapy Treatment Patient Details Name: Derek Stevens MRN: [id number] DOB: 1941-05-29 Today's Date: 08/18/2015    History of present illness Pt is a 75 year old male with PMHx of Hodgkin lymphoma 2A diag 1994, CVA, hypertension, alcohol abuse and admitted after fall and possible syncopal episode.  Lab work reveals hyponatremia. MRI: no acute findings. L spine xray negative for fx.   OT comments  Session focused on speaking with wife regarding DC plan and pts needs for A with ADL activtity, Wife now realizes she will not be able to care for him at home.  Pts wife referred to PT session this day and how much A pt needed  Follow Up Recommendations  SNF          Precautions / Restrictions Precautions Precautions: Fall Restrictions Weight Bearing Restrictions: No       Mobility Bed Mobility Overal bed mobility: Needs Assistance Bed Mobility: Supine to Sit;Sit to Supine     Supine to sit: Supervision Sit to supine: Supervision   General bed mobility comments: for safety  Transfers Overall transfer level: Needs assistance Equipment used: None Transfers: Sit to/from Stand Sit to Stand: Min assist         General transfer comment: Assist to rise, stabilize, control descent. Multiple attempts to successfully get to standing. Unsteady.    Balance Overall balance assessment: Needs assistance         Standing balance support: During functional activity Standing balance-Leahy Scale: Poor                     ADL                                         General ADL Comments: OT session focused on speaking with wife regarding pts functional level and amount of A he will need with ADL activity .  Pt wife feels he will need SNF.  Will communicate with SW for wife and DC planning      Vision                            Cognition   Behavior During Therapy: [facility] for tasks assessed/performed Overall Cognitive Status:  Impaired/Different from baseline Area of Impairment: Attention;Safety/judgement;Following commands;Awareness;Problem solving   Current Attention Level: Selective    Following Commands: Follows one step commands inconsistently Safety/Judgement: Decreased awareness of safety;Decreased awareness of deficits   Problem Solving: Requires tactile cues;Requires verbal cues                   Pertinent Vitals/ Pain       Pain Assessment: No/denies pain         Plan Discharge plan needs to be updated       End of Session     Activity Tolerance Patient limited by fatigue   Patient Left in bed;with call bell/phone within reach;with family/visitor present;with bed alarm set   Nurse Communication          Time: 1051-1101 OT Time Calculation (min): 10 min  Charges: OT General Charges $OT Visit: 1 Procedure OT Treatments $Self Care/Home Management : 8-22 mins  Divit Stipp D 08/18/2015, 11:07 AM

## 2015-08-18 NOTE — Care Management Important Message (Signed)
Important Message  Patient Details  Name: FILIMON WETTER MRN: [id number] Date of Birth: December 11, 1940   Medicare Important Message Given:  Yes    Camillo Flaming 08/18/2015, 10:25 AMImportant Message  Patient Details  Name: JAKSON DUCHATEAU MRN: [id number] Date of Birth: Jul 09, 1941   Medicare Important Message Given:  Yes    Camillo Flaming 08/18/2015, 10:25 AM

## 2015-08-18 NOTE — Clinical Social Work Note (Signed)
Clinical Social Work Assessment  Patient Details  Name: Derek Stevens MRN: [id number] Date of Birth: 1940/10/02  Date of referral:  08/18/15               Reason for consult:  Facility Placement                Permission sought to share information with:  Chartered certified accountant granted to share information::  Yes, Verbal Permission Granted  Name::        Agency::     Relationship::     Contact Information:     Housing/Transportation Living arrangements for the past 2 months:  Single Family Home Source of Information:  Spouse Patient Interpreter Needed:  None Criminal Activity/Legal Involvement Pertinent to Current Situation/Hospitalization:  No - Comment as needed Significant Relationships:  None Lives with:  Spouse Do you feel safe going back to the place where you live?  No Need for family participation in patient care:  Yes (Comment)  Care giving concerns:  CSW received consult from PT/OT that they have recommended SNF.    Social Worker assessment / plan:  CSW spoke with patient's wife, Derek Stevens to confirm that she is agreeable with plan for SNF.   Employment status:  Retired Nurse, adult PT Recommendations:  [place] / Referral to community resources:  [place]  Patient/Family's Response to care:  Patient's wife requested that we do a SNF search, sates that she does not feel like she would be able to take care of him at home in his weakened condition.   Patient/Family's Understanding of and Emotional Response to Diagnosis, Current Treatment, and Prognosis:  Patient's wife informed CSW that patient had fallen several times prior to admission.   Emotional Assessment Appearance:  Appears stated age Attitude/Demeanor/Rapport:    Affect (typically observed):    Orientation:  Oriented to Self Alcohol / Substance use:    Psych involvement (Current and /or in the community):  No  (Comment)  Discharge Needs  Concerns to be addressed:    Readmission within the last 30 days:    Current discharge risk:    Barriers to Discharge:      Standley Brooking, LCSW 08/18/2015, 3:09 PM

## 2015-08-19 DIAGNOSIS — F10231 Alcohol dependence with withdrawal delirium: Secondary | ICD-10-CM

## 2015-08-19 MED ORDER — HYDRALAZINE HCL 25 MG PO TABS: 25.0000 mg | ORAL_TABLET | Freq: Three times a day (TID) | ORAL | Status: DC

## 2015-08-19 MED ORDER — ENSURE ENLIVE PO LIQD: 237.0000 mL | Freq: Two times a day (BID) | ORAL | Status: DC

## 2015-08-19 MED ORDER — ACETAMINOPHEN 325 MG PO TABS: 650.0000 mg | ORAL_TABLET | Freq: Four times a day (QID) | ORAL | Status: DC | PRN

## 2015-08-19 MED ORDER — ADULT MULTIVITAMIN W/MINERALS CH: 1.0000 | ORAL_TABLET | Freq: Every day | ORAL | Status: DC

## 2015-08-19 MED ORDER — FOLIC ACID 1 MG PO TABS: 1.0000 mg | ORAL_TABLET | Freq: Every day | ORAL | Status: DC

## 2015-08-19 NOTE — Care Management Note (Signed)
Case Management Note  Patient Details  Name: TAQUON AMICUCCI MRN: [id number] Date of Birth: 1940/10/21  Subjective/Objective: 75 y.o. M admitted 08/15/2015 for Syncope. PT recommending STSNF at discharge.CSW following for disposition.                     Action/Plan:CM will sign off for now but will be available should CM needs arise.    Expected Discharge Date:  08/16/15               Expected Discharge Plan:  [place]  In-House Referral:     Discharge planning Services  CM Consult  Post Acute Care Choice:    Choice offered to:  Spouse  DME Arranged:    DME Agency:     HH Arranged:    [place] Agency:     Status of Service:  Completed, signed off  Medicare Important Message Given:  Yes Date Medicare IM Given:    Medicare IM give by:    Date Additional Medicare IM Given:    Additional Medicare Important Message give by:     If discussed at [place] of Stay Meetings, dates discussed:    Additional Comments:  Delrae Sawyers, RN 08/19/2015, 10:33 AM

## 2015-08-19 NOTE — Progress Notes (Signed)
Physical Therapy Treatment Patient Details Name: Derek Stevens MRN: [id number] DOB: [birth year] Today's Date: 08/19/2015    History of Present Illness Pt is a 75 year old male with PMHx of Hodgkin lymphoma 2A diag 1994, CVA, hypertension, alcohol abuse and admitted after fall and possible syncopal episode.  Lab work reveals hyponatremia. MRI: no acute findings. L spine xray negative for fx.    PT Comments    Pt ambulated 180' with RW and min A for maneuvering RW during turns. Gait is slow with decreased step length. He requires 24* supervision for safety.   Follow Up Recommendations  Supervision/Assistance - 24 hour;SNF     Equipment Recommendations  Rolling walker with 5" wheels    Recommendations for Other Services       Precautions / Restrictions Precautions Precautions: Fall Precaution Comments: reports 2 falls in past year Restrictions Weight Bearing Restrictions: No    Mobility  Bed Mobility Overal bed mobility: Needs Assistance Bed Mobility: Supine to Sit     Supine to sit: Min assist     General bed mobility comments: verbal/manual cues to initiate and complete movement, min A to raise trunk  Transfers Overall transfer level: Needs assistance Equipment used: Rolling walker (2 wheeled) Transfers: Sit to/from Stand Sit to Stand: Min guard         General transfer comment: min/guard for safety, verbal cues for hand placment  Ambulation/Gait   Ambulation Distance (Feet): 180 Feet Assistive device: Rolling walker (2 wheeled) Gait Pattern/deviations: Step-to pattern;Decreased step length - right;Decreased step length - left;Decreased stride length;Decreased weight shift to right;Decreased weight shift to left   Gait velocity interpretation: Below normal speed for age/gender General Gait Details: frequent verbal cues to increase step length with little response from pt, min A to maneuver RW during turns, no LOB   Stairs            Wheelchair  Mobility    Modified Rankin (Stroke Patients Only)       Balance           Standing balance support: Single extremity supported Standing balance-Leahy Scale: Fair                      Cognition Arousal/Alertness: Awake/alert Behavior During Therapy: WFL for tasks assessed/performed Overall Cognitive Status: Impaired/Different from baseline Area of Impairment: Attention;Safety/judgement;Following commands;Awareness;Problem solving   Current Attention Level: Selective   Following Commands: Follows one step commands inconsistently Safety/Judgement: Decreased awareness of safety   Problem Solving: Requires tactile cues;Requires verbal cues      Exercises Other Exercises Other Exercises: shoulder rolls x 5 B AROM sitting Other Exercises: R upper trap stretch 30 sec hold x 3    General Comments        Pertinent Vitals/Pain Pain Score: 2  Pain Location: R neck/shoulder Pain Descriptors / Indicators: Aching Pain Intervention(s): Monitored during session    Home Living                      Prior Function            PT Goals (current goals can now be found in the care plan section) Acute Rehab PT Goals Patient Stated Goal: likes to chop wood PT Goal Formulation: With patient/family Time For Goal Achievement: 08/23/15 Potential to Achieve Goals: Good Progress towards PT goals: Progressing toward goals    Frequency  Min 3X/week    PT Plan Current plan remains appropriate    Co-evaluation  End of Session Equipment Utilized During Treatment: Gait belt Activity Tolerance: Patient tolerated treatment well Patient left: in chair;with call bell/phone within reach;with family/visitor present     Time: [id number] PT Time Calculation (min) (ACUTE ONLY): 26 min  Charges:  $Gait Training: 8-22 mins $Therapeutic Exercise: 8-22 mins                    G Codes:      Philomena Doheny 08/19/2015, 10:19  AM [phone]

## 2015-08-19 NOTE — Discharge Summary (Addendum)
Physician Discharge Summary  Derek Stevens [id number] DOB: May 10, 1941 DOA: 08/15/2015  PCP: Ron Parker, MD  Admit date: 08/15/2015 Discharge date: 08/20/2015  Recommendations for Outpatient Follow-up:  1. Check CBC and BMP per SNF protocol 2. F/U with PCP in 1 week to make sure patient;s symptoms are stable   Discharge Diagnoses:  Active Problems:   Syncope   Syncopal episodes   Alcohol withdrawal delirium (HCC)   Alcohol abuse   Discharge Condition: stable   Diet recommendation: as tolerated   History of present illness:  75 year old male with past medical history of hypertension, dyslipidemia who presented to hospital status post fall at home while chopping wood. Patient is not good historian at this time as he is disoriented. MRI and CT head on the admission did not reveal acute intracranial findings.syncopal episode was felt to be secondary to dehydration versus alcohol withdrawal.  Hospital Course:   Assessment/Plan:    Principal problem: Acute encephalopathy / syncope - Unclear etiology, possible alcohol withdrawal versus intoxication however alcohol level was not obtained on the admission - CIWA protocol started on admission - No report of withdrawals in hospital  - Continue multivitamin and folic acid    Active problems: Essential hypertension - Continue carvedilol 12.5 mg twice daily and hydralazine 25 mg every 8 hours  - He may resume lisinopril on discharge since renal function WNL  Prior CVA - Continue ASA   H/o Hodgkin's lymphoma in 1994, s/p XRT and chemo - Stable   Acute kidney injury - Secondary to dehydration - Improved with IV fluids  DVT Prophylaxis  - SCDs bilaterally  Code Status: Full.  Family Communication: plan of care discussed with the patient's wife at the bedside this am   IV access:  Peripheral IV  Procedures and diagnostic studies:   Dg Chest 2 View 08/15/2015 No active cardiopulmonary disease.  Electronically Signed By: Nolon Nations M.D. On: 08/15/2015 10:04   Dg Lumbar Spine Complete 08/15/2015 Mild degenerative changes is seen involving the lumbar spine. There is no evidence of fracture or spondylolisthesis involving the lumbar spine. Mildly displaced right twelfth rib fracture is noted of indeterminate age. Electronically Signed By: Marijo Conception, M.D. On: 08/15/2015 10:05   Dg Pelvis 1-2 Views 08/15/2015 Negative. Electronically Signed By: Nolon Nations M.D. On: 08/15/2015 10:06   Ct Head Wo Contrast 08/15/2015 1. No acute intracranial abnormality. 2. Sinus disease. 3. Small vessel ischemic change. Electronically Signed By: Abigail Miyamoto M.D. On: 08/15/2015 09:38   Mr Brain Wo Contrast 08/15/2015 Global atrophy with small vessel disease, progressive since 2012. No acute intracranial findings of ischemia or posttraumatic sequelae. 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 Chronic occlusion RIGHT vertebral artery.   Medical Consultants:  None   Other Consultants:  PT  IAnti-Infectives:   None    Signed:  Leisa Lenz, MD  [facility] 08/19/2015, 9:41 PM  Pager #: [phone]  Time spent in minutes: more than 30 minutes   Discharge Exam: Filed Vitals:   08/18/15 2124 08/19/15 1400  BP: 167/97 151/88  Pulse: 83 65  Temp: 98 F (36.7 C) 98.3 F (36.8 C)  Resp: 16 16   Filed Vitals:   08/18/15 1304 08/18/15 1530 08/18/15 2124 08/19/15 1400  BP: 172/94 155/88 167/97 151/88  Pulse: 58  83 65  Temp: 97.8 F (36.6 C)  98 F (36.7 C) 98.3 F (36.8 C)  TempSrc: Oral  Oral Oral  Resp: 16  16 16  Height:      Weight:      SpO2: 100%  100%     General: Pt is alert, not in acute distress Cardiovascular: Regular rate and rhythm, S1/S2 appreciated  Respiratory: no wheezing, no crackles, no rhonchi Abdominal: Soft, non tender, non distended,  bowel sounds +, no guarding Extremities: no edema, no cyanosis, pulses palpable bilaterally DP and PT Neuro: Grossly nonfocal  Discharge Instructions  Discharge Instructions    Call MD for:  difficulty breathing, headache or visual disturbances    Complete by:  As directed      Call MD for:  hives    Complete by:  As directed      Call MD for:  persistant dizziness or light-headedness    Complete by:  As directed      Call MD for:  persistant nausea and vomiting    Complete by:  As directed      Call MD for:  severe uncontrolled pain    Complete by:  As directed      Diet - low sodium heart healthy    Complete by:  As directed      Increase activity slowly    Complete by:  As directed             Medication List    TAKE these medications        acetaminophen 325 MG tablet  Commonly known as:  TYLENOL  Take 2 tablets (650 mg total) by mouth every 6 (six) hours as needed for mild pain or headache.     aspirin 325 MG tablet  Take 325 mg by mouth daily.     carvedilol 12.5 MG tablet  Commonly known as:  COREG  Take 12.5 mg by mouth 2 (two) times daily with a meal.     feeding supplement (ENSURE ENLIVE) Liqd  Take 237 mLs by mouth 2 (two) times daily between meals.     FISH OIL PO  Take 1 capsule by mouth daily.     folic acid 1 MG tablet  Commonly known as:  FOLVITE  Take 1 tablet (1 mg total) by mouth daily.     hydrALAZINE 25 MG tablet  Commonly known as:  APRESOLINE  Take 1 tablet (25 mg total) by mouth every 8 (eight) hours.     lisinopril 20 MG tablet  Commonly known as:  PRINIVIL,ZESTRIL  Take 20 mg by mouth daily.     multivitamin with minerals Tabs tablet  Take 1 tablet by mouth daily.     rosuvastatin 10 MG tablet  Commonly known as:  CRESTOR  Take 10 mg by mouth daily.           Follow-up Information    Follow up with [facility].   Why:  HHPT/HHOT   Contact information:   [address] [place]  [address] [phone]       Follow up with Ron Parker, MD. Schedule an appointment as soon as possible for a visit in 1 week.   Specialty:  Family Medicine   Why:  Follow up appt after recent hospitalization   Contact information:   [address] [place] [address] [phone]        The results of significant diagnostics from this hospitalization (including imaging, microbiology, ancillary and laboratory) are listed below for reference.    Significant Diagnostic Studies: Dg Chest 2 View  08/15/2015  CLINICAL DATA:  Fall.  History of stroke.  EXAM: CHEST  2 VIEW COMPARISON:  02/10/2011 FINDINGS: The heart size and mediastinal contours are within normal limits. Both lungs are clear. The visualized skeletal structures are unremarkable. IMPRESSION: No active cardiopulmonary disease. Electronically Signed   By: Nolon Nations M.D.   On: 08/15/2015 10:04   Dg Lumbar Spine Complete  08/15/2015  CLINICAL DATA:  Severe bilateral low back pain after multiple falls. EXAM: LUMBAR SPINE - COMPLETE 4+ VIEW COMPARISON:  None. FINDINGS: Mildly displaced fracture of the right twelfth rib is noted posteriorly of indeterminate age. No fracture or spondylolisthesis is seen involving the lumbar spine. Disc spaces are well-maintained. Anterior osteophyte formation is noted at L1-2, L2-3 and L3-4. Hypertrophy of posterior facet joints is seen at L5-S1 secondary to degenerative joint disease. IMPRESSION: Mild degenerative changes is seen involving the lumbar spine. There is no evidence of fracture or spondylolisthesis involving the lumbar spine. Mildly displaced right twelfth rib fracture is noted of indeterminate age. Electronically Signed   By: Marijo Conception, M.D.   On: 08/15/2015 10:05   Dg Pelvis 1-2 Views  08/15/2015  CLINICAL DATA:  Fall.  Back pain. EXAM: PELVIS - 1-2 VIEW COMPARISON:  None. FINDINGS: There is no evidence of pelvic fracture or diastasis. No pelvic bone lesions are  seen. IMPRESSION: Negative. Electronically Signed   By: Nolon Nations M.D.   On: 08/15/2015 10:06   Ct Head Wo Contrast  08/15/2015  CLINICAL DATA:  Fall 2 days ago. Repeat fall yesterday. History of stroke 4 years ago. EXAM: CT HEAD WITHOUT CONTRAST TECHNIQUE: Contiguous axial images were obtained from the base of the skull through the vertex without intravenous contrast. COMPARISON:  02/10/2011 FINDINGS: Sinuses/Soft tissues: Minimal mucosal thickening of the sphenoid sinuses, ethmoid air cells. No significant soft tissue swelling. No skull fracture. Clear mastoid air cells. Intracranial: Moderate low density in the periventricular white matter likely related to small vessel disease. No mass lesion, hemorrhage, hydrocephalus, acute infarct, intra-axial, or extra-axial fluid collection. IMPRESSION: 1.  No acute intracranial abnormality. 2. Sinus disease. 3. Small vessel ischemic change. Electronically Signed   By: Abigail Miyamoto M.D.   On: 08/15/2015 09:38   Mr Brain Wo Contrast  08/15/2015  CLINICAL DATA:  Recurrent syncopal episodes with dizziness. Head trauma is reported occurring with at least one of these episodes. Stroke risk factors include hypertension and previous cerebral infarction. Remote history of lymphoma. EXAM: MRI HEAD WITHOUT CONTRAST TECHNIQUE: Multiplanar, multiecho pulse sequences of the brain and surrounding structures were obtained without intravenous contrast. COMPARISON:  MRI brain 12/23/2010.  CT head earlier today. FINDINGS: No evidence for acute infarction, acute hemorrhage, mass lesion, hydrocephalus, or extra-axial fluid. Global atrophy. Moderately advanced chronic microvascular ischemic change affects the periventricular and subcortical white matter. Atrophy and small vessel disease are progressive since 2012. There is a moderate-sized remote infarct affecting the LEFT basal ganglia. A smaller area of chronic infarction affects the RIGHT inferior vermis. This is related to what  was probably an acute RIGHT vertebral occlusion in 2012. Flow voids are maintained on today's study in the BILATERAL carotid, LEFT vertebral, and basilar arteries. No flow related enhancement is seen in the RIGHT vertebral. 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 Cerebral amyloid angiopathy is less favored. No midline abnormality. Cervical spondylosis. Negative orbits. Mild chronic sinus disease. No mastoid fluid. Extracranial soft tissues unremarkable. No visible extracranial lymphoid enlargement. IMPRESSION: Global atrophy with small  vessel disease, progressive since 2012. No acute intracranial findings of ischemia or posttraumatic sequelae. 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 Chronic occlusion RIGHT vertebral artery. Electronically Signed   By: Staci Righter M.D.   On: 08/15/2015 20:05    Microbiology: No results found for this or any previous visit (from the past 240 hour(s)).   Labs: Basic Metabolic Panel:  Recent Labs Lab 08/15/15 0920 08/16/15 1000 08/17/15 0740  NA 128* 131* 136  K 4.3 3.8 4.3  CL 92* 94* 101  CO2 22 26 26   GLUCOSE 105* 146* 111*  BUN 8 11 7   CREATININE 1.02 1.50* 0.96  CALCIUM 8.7* 8.8* 8.7*   Liver Function Tests:  Recent Labs Lab 08/15/15 0920  AST 29  ALT 16*  ALKPHOS 74  BILITOT 0.8  PROT 7.8  ALBUMIN 4.3    Recent Labs Lab 08/15/15 1830  LIPASE 45   No results for input(s): AMMONIA in the last 168 hours. CBC:  Recent Labs Lab 08/15/15 0920 08/16/15 1000  WBC 5.0 3.7*  NEUTROABS 2.9  --   HGB 13.4 13.3  HCT 39.3 40.0  MCV 71.1* 73.1*  PLT 206 221   Cardiac Enzymes: No results for input(s): CKTOTAL, CKMB, CKMBINDEX, TROPONINI in the last 168 hours. BNP: BNP (last 3 results) No results for input(s):  BNP in the last 8760 hours.  ProBNP (last 3 results) No results for input(s): PROBNP in the last 8760 hours.  CBG: No results for input(s): GLUCAP in the last 168 hours.

## 2015-08-19 NOTE — Discharge Instructions (Signed)
Hypertension Hypertension, commonly called high blood pressure, is when the force of blood pumping through your arteries is too strong. Your arteries are the blood vessels that carry blood from your heart throughout your body. A blood pressure reading consists of a higher number over a lower number, such as 110/72. The higher number (systolic) is the pressure inside your arteries when your heart pumps. The lower number (diastolic) is the pressure inside your arteries when your heart relaxes. Ideally you want your blood pressure below 120/80. Hypertension forces your heart to work harder to pump blood. Your arteries may become narrow or stiff. Having untreated or uncontrolled hypertension can cause heart attack, stroke, kidney disease, and other problems. RISK FACTORS Some risk factors for high blood pressure are controllable. Others are not.  Risk factors you cannot control include:   Race. You may be at higher risk if you are African American.  Age. Risk increases with age.  Gender. Men are at higher risk than women before age 45 years. After age 65, women are at higher risk than men. Risk factors you can control include:  Not getting enough exercise or physical activity.  Being overweight.  Getting too much fat, sugar, calories, or salt in your diet.  Drinking too much alcohol. SIGNS AND SYMPTOMS Hypertension does not usually cause signs or symptoms. Extremely high blood pressure (hypertensive crisis) may cause headache, anxiety, shortness of breath, and nosebleed. DIAGNOSIS To check if you have hypertension, your health care provider will measure your blood pressure while you are seated, with your arm held at the level of your heart. It should be measured at least twice using the same arm. Certain conditions can cause a difference in blood pressure between your right and left arms. A blood pressure reading that is higher than normal on one occasion does not mean that you need treatment. If  it is not clear whether you have high blood pressure, you may be asked to return on a different day to have your blood pressure checked again. Or, you may be asked to monitor your blood pressure at home for 1 or more weeks. TREATMENT Treating high blood pressure includes making lifestyle changes and possibly taking medicine. Living a healthy lifestyle can help lower high blood pressure. You may need to change some of your habits. Lifestyle changes may include:  Following the DASH diet. This diet is high in fruits, vegetables, and whole grains. It is low in salt, red meat, and added sugars.  Keep your sodium intake below 2,300 mg per day.  Getting at least 30-45 minutes of aerobic exercise at least 4 times per week.  Losing weight if necessary.  Not smoking.  Limiting alcoholic beverages.  Learning ways to reduce stress. Your health care provider may prescribe medicine if lifestyle changes are not enough to get your blood pressure under control, and if one of the following is true:  You are 18-59 years of age and your systolic blood pressure is above 140.  You are 60 years of age or older, and your systolic blood pressure is above 150.  Your diastolic blood pressure is above 90.  You have diabetes, and your systolic blood pressure is over 140 or your diastolic blood pressure is over 90.  You have kidney disease and your blood pressure is above 140/90.  You have heart disease and your blood pressure is above 140/90. Your personal target blood pressure may vary depending on your medical conditions, your age, and other factors. HOME CARE INSTRUCTIONS    Have your blood pressure rechecked as directed by your health care provider.   Take medicines only as directed by your health care provider. Follow the directions carefully. Blood pressure medicines must be taken as prescribed. The medicine does not work as well when you skip doses. Skipping doses also puts you at risk for  problems.  Do not smoke.   Monitor your blood pressure at home as directed by your health care provider. SEEK MEDICAL CARE IF:   You think you are having a reaction to medicines taken.  You have recurrent headaches or feel dizzy.  You have swelling in your ankles.  You have trouble with your vision. SEEK IMMEDIATE MEDICAL CARE IF:  You develop a severe headache or confusion.  You have unusual weakness, numbness, or feel faint.  You have severe chest or abdominal pain.  You vomit repeatedly.  You have trouble breathing. MAKE SURE YOU:   Understand these instructions.  Will watch your condition.  Will get help right away if you are not doing well or get worse.   This information is not intended to replace advice given to you by your health care provider. Make sure you discuss any questions you have with your health care provider.   Document Released: 07/31/2005 Document Revised: 12/15/2014 Document Reviewed: 05/23/2013 Elsevier Interactive Patient Education 2016 [employer]  

## 2015-08-20 NOTE — Clinical Social Work Placement (Signed)
   CLINICAL SOCIAL WORK PLACEMENT  NOTE  Date:  08/20/2015  Patient Details  Name: Derek Stevens MRN: [id number] Date of Birth: 01-23-1941  Clinical Social Work is seeking post-discharge placement for this patient at the [place] level of care (*CSW will initial, date and re-position this form in  chart as items are completed):  Yes   Patient/family provided with [facility] Work Department's list of facilities offering this level of care within the geographic area requested by the patient (or if unable, by the patient's family).  Yes   Patient/family informed of their freedom to choose among providers that offer the needed level of care, that participate in Medicare, Medicaid or managed care program needed by the patient, have an available bed and are willing to accept the patient.  Yes   Patient/family informed of [facility]'s ownership interest in [facility] and [facility], as well as of the fact that they are under no obligation to receive care at these facilities.  PASRR submitted to EDS on 08/18/15     PASRR number received on 08/18/15     Existing PASRR number confirmed on       FL2 transmitted to all facilities in geographic area requested by pt/family on 08/18/15     FL2 transmitted to all facilities within larger geographic area on       Patient informed that his/her managed care company has contracts with or will negotiate with certain facilities, including the following:        Yes   Patient/family informed of bed offers received.  Patient chooses bed at [facility]     Physician recommends and patient chooses bed at      Patient to be transferred to [facility] on 08/20/15.  Patient to be transferred to facility by [facility]     Patient family notified on 08/20/15 of transfer.  Name of family member notified:  SPOUSE     PHYSICIAN       Additional Comment: Pt / spouse are in agreemnt  with plan to d/c to [facility] today. [employer] provided authorization for SNF placement. [facility] transport required. Pt / spouse are aware out of pocket costs may be associated with [facility] transport. NSG reviewed d/c summary, scripts, avs. Scripts included in d/c packet. D/c summary sent to SNF for review prior to dc.   _______________________________________________ Luretha Rued, LCSW 08/20/2015, 1:26 PM

## 2015-08-20 NOTE — Progress Notes (Signed)
Pt stable for discharge to SNF today No changes in medical management since 08/19/2015 Please refer to D/C summary completed 08/19/2015.  Leisa Lenz [facility] [phone]

## 2015-08-20 NOTE — Progress Notes (Signed)
Occupational Therapy Treatment Patient Details Name: Derek Stevens MRN: [id number] DOB: March 06, 1941 Today's Date: 08/20/2015    History of present illness Pt is a 75 year old male with PMHx of Hodgkin lymphoma 2A diag 1994, CVA, hypertension, alcohol abuse and admitted after fall and possible syncopal episode.  Lab work reveals hyponatremia. MRI: no acute findings. L spine xray negative for fx.   OT comments  Pt very pleasant and cooperative. Pts chair alarm going off at times- pt not getting up but scooting around in chair           Precautions / Restrictions Precautions Precautions: Fall Precaution Comments: reports 2 falls in past year Restrictions Weight Bearing Restrictions: No       Mobility Bed Mobility Overal bed mobility: Needs Assistance Bed Mobility: Supine to Sit     Supine to sit: Supervision     General bed mobility comments: pt in chair  Transfers Overall transfer level: Needs assistance Equipment used: 1 person hand held assist Transfers: Sit to/from Stand;Stand Pivot Transfers Sit to Stand: Min guard Stand pivot transfers: Min guard       General transfer comment: min/guard for safety, verbal cues for hand placment        ADL       Grooming: Standing;Supervision/safety;Wash/dry face;Oral care;Wash/dry hands               Lower Body Dressing: Supervision/safety;Sit to/from stand (shoes and socks)   Toilet Transfer: Regular Toilet;Min guard             General ADL Comments: pt tends to forget to use walker.  OT noted several times pt loss balance- but was able to self correct by holding onto grab bar or sink                Cognition   Behavior During Therapy: [facility] for tasks assessed/performed Overall Cognitive Status: Impaired/Different from baseline          Following Commands: Follows one step commands with increased time;Follows one step commands consistently Safety/Judgement: Decreased awareness of safety    Problem Solving: Requires tactile cues;Requires verbal cues General Comments: pt very cooperative and pleaseant; decr awareness of deficits, talks a great deal and unsure if this is his baseline (tangential at  times); easily re-directed            General Comments      Pertinent Vitals/ Pain       Pain Assessment: No/denies pain            Progress Toward Goals  OT Goals(current goals can now be found in the care plan section)  Progress towards OT goals: Progressing toward goals  Acute Rehab OT Goals Patient Stated Goal: likes to chop wood  Plan Discharge plan remains appropriate       End of Session     Activity Tolerance Patient tolerated treatment well   Patient Left in chair;with call bell/phone within reach;with chair alarm set   Nurse Communication Mobility status        Time: [id number] OT Time Calculation (min): 13 min  Charges: OT General Charges $OT Visit: 1 Procedure OT Treatments $Self Care/Home Management : 8-22 mins  Derek Stevens D 08/20/2015, 10:59 AM

## 2015-08-20 NOTE — Evaluation (Addendum)
Physical Therapy Treatment  Patient Details Name: Derek Stevens MRN: [id number] DOB: 1940/10/29 Today's Date: 08/20/2015   History of Present Illness  Pt is a 75 year old male with PMHx of Hodgkin lymphoma 2A diag 1994, CVA, hypertension, alcohol abuse and admitted after fall and possible syncopal episode.  Lab work reveals hyponatremia. MRI: no acute findings. L spine xray negative for fx.  Clinical Impression  Pt progressing well although will benefit from SNF d/t safety and balance issues, continues to be at risk for falls    Follow Up Recommendations Supervision/Assistance - 24 hour;SNF    Equipment Recommendations  Rolling walker with 5" wheels    Recommendations for Other Services       Precautions / Restrictions Precautions Precautions: Fall Precaution Comments: reports 2 falls in past year Restrictions Weight Bearing Restrictions: No      Mobility  Bed Mobility Overal bed mobility: Needs Assistance Bed Mobility: Supine to Sit     Supine to sit: Supervision     General bed mobility comments: for safety  Transfers Overall transfer level: Needs assistance Equipment used: Rolling walker (2 wheeled);None Transfers: Sit to/from Stand Sit to Stand: Min guard         General transfer comment: min/guard for safety, verbal cues for hand placment  Ambulation/Gait Ambulation/Gait assistance: Min guard;Min assist Ambulation Distance (Feet): 200 Feet ( amb 8' with min assist and no AD) Assistive device: Rolling walker (2 wheeled) Gait Pattern/deviations: Step-through pattern;Decreased stride length;Shuffle;Narrow base of support Gait velocity: decreased   General Gait Details: frequent verbal cues to increase step length with little response from pt, min A to maneuver RW during turns, dizzy initally but subsided quickly  Stairs            Wheelchair Mobility    Modified Rankin (Stroke Patients Only)       Balance                                              Pertinent Vitals/Pain Pain Assessment: No/denies pain    Home Living                        Prior Function                 Hand Dominance        Extremity/Trunk Assessment                         Communication      Cognition Arousal/Alertness: Awake/alert Behavior During Therapy: WFL for tasks assessed/performed Overall Cognitive Status: Impaired/Different from baseline         Following Commands: Follows one step commands with increased time Safety/Judgement: Decreased awareness of safety   Problem Solving: Requires tactile cues;Requires verbal cues General Comments: pt very cooperative and pleaseant; decr awareness of deficits, talks a great deal and unsure if this is his baseline (tangential at  times); easily re-directed    General Comments      Exercises        Assessment/Plan    PT Assessment    PT Diagnosis     PT Problem List    PT Treatment Interventions     PT Goals (Current goals can be found in the Care Plan section) Acute Rehab PT Goals Patient Stated Goal: likes to chop  wood PT Goal Formulation: With patient/family Time For Goal Achievement: 08/23/15 Potential to Achieve Goals: Good    Frequency Min 3X/week   Barriers to discharge        Co-evaluation               End of Session Equipment Utilized During Treatment: Gait belt Activity Tolerance: Patient tolerated treatment well Patient left: in chair;with call bell/phone within reach;with chair alarm set Nurse Communication: Mobility status         Time: [id number] PT Time Calculation (min) (ACUTE ONLY): 18 min   Charges:     PT Treatments $Gait Training: 8-22 mins   PT G Codes:        Erasmo Vertz 09/18/15, 9:31 AM

## 2016-05-07 ENCOUNTER — Encounter (HOSPITAL_COMMUNITY): Payer: Self-pay | Admitting: *Deleted

## 2016-05-07 ENCOUNTER — Emergency Department (HOSPITAL_COMMUNITY)
Admission: EM | Admit: 2016-05-07 | Discharge: 2016-05-07 | Disposition: A | Discharge status: Home / Self Care | Payer: Medicare HMO | Attending: Emergency Medicine | Admitting: Emergency Medicine

## 2016-05-07 ENCOUNTER — Emergency Department (HOSPITAL_COMMUNITY): Payer: Medicare HMO

## 2016-05-07 DIAGNOSIS — S63501A Unspecified sprain of right wrist, initial encounter: Secondary | ICD-10-CM | POA: Diagnosis not present

## 2016-05-07 DIAGNOSIS — S6991XA Unspecified injury of right wrist, hand and finger(s), initial encounter: Secondary | ICD-10-CM | POA: Diagnosis present

## 2016-05-07 DIAGNOSIS — X58XXXA Exposure to other specified factors, initial encounter: Secondary | ICD-10-CM | POA: Insufficient documentation

## 2016-05-07 DIAGNOSIS — Y929 Unspecified place or not applicable: Secondary | ICD-10-CM | POA: Insufficient documentation

## 2016-05-07 DIAGNOSIS — Z8572 Personal history of non-Hodgkin lymphomas: Secondary | ICD-10-CM | POA: Insufficient documentation

## 2016-05-07 DIAGNOSIS — Y9311 Activity, swimming: Secondary | ICD-10-CM | POA: Diagnosis not present

## 2016-05-07 DIAGNOSIS — Z87891 Personal history of nicotine dependence: Secondary | ICD-10-CM | POA: Insufficient documentation

## 2016-05-07 DIAGNOSIS — J45909 Unspecified asthma, uncomplicated: Secondary | ICD-10-CM | POA: Insufficient documentation

## 2016-05-07 DIAGNOSIS — Z7982 Long term (current) use of aspirin: Secondary | ICD-10-CM | POA: Insufficient documentation

## 2016-05-07 DIAGNOSIS — Z8673 Personal history of transient ischemic attack (TIA), and cerebral infarction without residual deficits: Secondary | ICD-10-CM | POA: Insufficient documentation

## 2016-05-07 DIAGNOSIS — Y999 Unspecified external cause status: Secondary | ICD-10-CM | POA: Diagnosis not present

## 2016-05-07 DIAGNOSIS — Z79899 Other long term (current) drug therapy: Secondary | ICD-10-CM | POA: Insufficient documentation

## 2016-05-07 DIAGNOSIS — I10 Essential (primary) hypertension: Secondary | ICD-10-CM | POA: Diagnosis not present

## 2016-05-07 NOTE — Discharge Instructions (Signed)
Please read and follow all provided instructions.  Your diagnoses today include:  1. Right wrist sprain, initial encounter     Tests performed today include:  An x-ray of your wrist - does NOT show any broken bones  Vital signs. See below for your results today.   Medications prescribed:   None  Take any prescribed medications only as directed.  Home care instructions:   Follow any educational materials contained in this packet  Wear your splint for at least one week or until seen by a physician for a follow-up examination.  Follow R.I.C.E. Protocol:  R - rest your injury   I  - use ice on injury without applying directly to skin  C - compress injury with bandage or splint  E - elevate the injury above the level of your heart as much as possible to reduce pain and swelling  Follow-up instructions: Please follow-up with your primary care provider or the provided orthopedic (bone specialist) if you continue to have significant pain or trouble using your wrist in 1 week. In this case you may have a severe injury that requires further care.   Return instructions:   Please return if your fingers are numb or tingling, appear very red, white, gray or blue, or you have severe pain (also elevate wrist and loosen splint or wrap)  Please return if you have difficulty moving your fingers.  Please return to the Emergency Department if you experience worsening symptoms.   Please return if you have any other emergent concerns.  Additional Information:  Your vital signs today were: BP 155/79    Pulse (!) 53    Temp 98.4 F (36.9 C) (Oral)    Resp 18    Ht 5\' 6"  (1.676 m)    Wt 68 kg    SpO2 100%    BMI 24.21 kg/m  If your blood pressure (BP) was elevated above 135/85 this visit, please have this repeated by your doctor within one month. -------------- Wrist injuries are frequent in adults and children. A sprain is an injury to the ligaments that hold your bones together. A  strain is an injury to muscle or muscle tendons (cord like structure) from stretching or pulling.   Remember the importance of follow-up and possible follow-up x-rays. Improvement in pain level is not 100% insurance of not having a fracture. --------------

## 2016-05-07 NOTE — ED Triage Notes (Signed)
Pt reports right wrist swelling and pain for several days, unsure of any injury.

## 2016-05-07 NOTE — ED Provider Notes (Signed)
[facility] DEPT Provider Note   CSN: [id number] Arrival date & time: 05/07/16  0750     History   Chief Complaint Chief Complaint  Patient presents with  . Wrist Pain    HPI Derek Stevens is a 75 y.o. male.  Patient presents with complaint of right wrist pain and swelling starting this morning. Patient states that he does strenuous activities like swimming a sledgehammer and thinks this may have contributed. No other injuries reported. No fevers, nausea or vomiting. No history of immunocompromise or diabetes. No history of heart attack or kidney problems. Patient has had a previous stroke for which he takes aspirin daily. The onset of this condition was acute. The course is waxing and waning. Aggravating factors: Movement. Alleviating factors: none.        Past Medical History:  Diagnosis Date  . Asthma   . Cancer ([place])    lymphoma-1994  . GERD (gastroesophageal reflux disease)   . Hypertension   . Murmur, heart   . Stroke [facility]) 12/17/2010    Patient Active Problem List   Diagnosis Date Noted  . Alcohol withdrawal delirium ([place]) 08/17/2015  . Alcohol abuse 08/17/2015  . Syncope 08/15/2015  . Syncopal episodes 08/15/2015    Past Surgical History:  Procedure Laterality Date  . ANGIOPLASTY         Home Medications    Prior to Admission medications   Medication Sig Start Date End Date Taking? Authorizing Provider  acetaminophen (TYLENOL) 325 MG tablet Take 2 tablets (650 mg total) by mouth every 6 (six) hours as needed for mild pain or headache. 08/19/15   Robbie Lis, MD  aspirin 325 MG tablet Take 325 mg by mouth daily.    Historical Provider, MD  carvedilol (COREG) 12.5 MG tablet Take 12.5 mg by mouth 2 (two) times daily with a meal.    Historical Provider, MD  feeding supplement, ENSURE ENLIVE, (ENSURE ENLIVE) LIQD Take 237 mLs by mouth 2 (two) times daily between meals. 08/19/15   Robbie Lis, MD  folic acid (FOLVITE) 1 MG tablet Take 1 tablet (1 mg  total) by mouth daily. 08/19/15   Robbie Lis, MD  hydrALAZINE (APRESOLINE) 25 MG tablet Take 1 tablet (25 mg total) by mouth every 8 (eight) hours. 08/19/15   Robbie Lis, MD  lisinopril (PRINIVIL,ZESTRIL) 20 MG tablet Take 20 mg by mouth daily.    Historical Provider, MD  Multiple Vitamin (MULTIVITAMIN WITH MINERALS) TABS tablet Take 1 tablet by mouth daily. 08/19/15   Robbie Lis, MD  Omega-3 Fatty Acids (FISH OIL PO) Take 1 capsule by mouth daily.    Historical Provider, MD  rosuvastatin (CRESTOR) 10 MG tablet Take 10 mg by mouth daily.    Historical Provider, MD    Family History Family History  Problem Relation Age of Onset  . Lung cancer Father   . Lung cancer Brother   . Colon cancer Neg Hx   . Esophageal cancer Neg Hx   . Rectal cancer Neg Hx   . Stomach cancer Neg Hx     Social History Social History  Substance Use Topics  . Smoking status: Former Smoker    Quit date: 08/15/1971  . Smokeless tobacco: Current User  . Alcohol use No     Allergies   Review of patient's allergies indicates no known allergies.   Review of Systems Review of Systems  Constitutional: Negative for activity change.  Musculoskeletal: Positive for arthralgias and joint swelling. Negative  for back pain, gait problem and neck pain.  Skin: Negative for wound.  Neurological: Negative for weakness and numbness.     Physical Exam Updated Vital Signs BP 185/89   Pulse (!) 57   Temp 98.4 F (36.9 C) (Oral)   Resp 18   Ht 5\' 6"  (1.676 m)   Wt 68 kg   SpO2 96%   BMI 24.21 kg/m   Physical Exam  Constitutional: He appears well-developed and well-nourished.  HENT:  Head: Normocephalic and atraumatic.  Eyes: Conjunctivae are normal.  Neck: Normal range of motion. Neck supple.  Cardiovascular: Normal pulses.  Exam reveals no decreased pulses.   Pulses:      Radial pulses are 2+ on the right side, and 2+ on the left side.  Musculoskeletal: He exhibits tenderness (Mild). He exhibits no  edema.       Right elbow: Normal.      Right wrist: Normal.       Right forearm: Normal.       Right hand: He exhibits tenderness, decreased capillary refill and swelling. He exhibits normal range of motion, no bony tenderness, no deformity and no laceration. Normal sensation noted. He exhibits no wrist extension trouble.       Hands: Neurological: He is alert. No sensory deficit.  Motor, sensation, and vascular distal to the injury is fully intact.   Skin: Skin is warm and dry.  Psychiatric: He has a normal mood and affect.  Nursing note and vitals reviewed.    ED Treatments / Results   Radiology Dg Wrist Complete Right  Result Date: 05/07/2016 CLINICAL DATA:  Pain and swelling.  No trauma history submitted. EXAM: RIGHT WRIST - COMPLETE 3+ VIEW COMPARISON:  None. FINDINGS: Significant osteoarthritis, with joint space narrowing most apparent the radiocarpal articulation. Chondrocalcinosis within the triangular fibrocartilage. Vascular calcifications. No acute fracture or dislocation. 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 Vascular calcifications. Electronically Signed   By: Abigail Miyamoto M.D.   On: 05/07/2016 09:35    Procedures Procedures (including critical care time)  Medications Ordered in ED Medications - No data to display   Initial Impression / Assessment and Plan / ED Course  I have reviewed the triage vital signs and the nursing notes.  Pertinent labs & imaging results that were available during my care of the patient were reviewed by me and considered in my medical decision making (see chart for details).  Clinical Course   Patient seen and examined. Pending x-ray. No concern for septic arthritis. Patient has active ROM with minimal pain. No risk factors or systemic symptoms.    Vital signs reviewed and are as follows: BP  185/89   Pulse (!) 57   Temp 98.4 F (36.9 C) (Oral)   Resp 18   Ht 5\' 6"  (1.676 m)   Wt 68 kg   SpO2 96%   BMI 24.21 kg/m    10:19 AM X-ray neg. Discussed pt with Dr. Laneta Simmers.   D/c to home with ortho/PCP f/u, splint. Patient on ASA daily. Return with worsening symptoms, other concerns.   Final Clinical Impressions(s) / ED Diagnoses   Final diagnoses:  Right wrist sprain, initial encounter   Patient with right wrist pain, negative imaging. May represent deQuervain tenosynovitis given exam or osteoarthritis. No concern for septic arthritis.   New Prescriptions New Prescriptions   No medications on file     Carlisle Cater, [place] 05/07/16 1021  Leo Grosser, MD 05/07/16 [phone]

## 2016-11-08 ENCOUNTER — Inpatient Hospital Stay (HOSPITAL_COMMUNITY)
Admission: EM | Admit: 2016-11-08 | Discharge: 2016-11-12 | DRG: 640 | Disposition: A | Discharge status: Home Health | Payer: Medicare HMO | Attending: Internal Medicine | Admitting: Internal Medicine

## 2016-11-08 ENCOUNTER — Encounter (HOSPITAL_COMMUNITY): Payer: Self-pay | Admitting: Emergency Medicine

## 2016-11-08 ENCOUNTER — Observation Stay (HOSPITAL_COMMUNITY): Payer: Medicare HMO

## 2016-11-08 DIAGNOSIS — Z86010 Personal history of colonic polyps: Secondary | ICD-10-CM

## 2016-11-08 DIAGNOSIS — F17220 Nicotine dependence, chewing tobacco, uncomplicated: Secondary | ICD-10-CM | POA: Diagnosis present

## 2016-11-08 DIAGNOSIS — R630 Anorexia: Secondary | ICD-10-CM | POA: Diagnosis not present

## 2016-11-08 DIAGNOSIS — I10 Essential (primary) hypertension: Secondary | ICD-10-CM | POA: Diagnosis not present

## 2016-11-08 DIAGNOSIS — Z79899 Other long term (current) drug therapy: Secondary | ICD-10-CM

## 2016-11-08 DIAGNOSIS — J45909 Unspecified asthma, uncomplicated: Secondary | ICD-10-CM | POA: Diagnosis present

## 2016-11-08 DIAGNOSIS — C787 Secondary malignant neoplasm of liver and intrahepatic bile duct: Secondary | ICD-10-CM | POA: Diagnosis present

## 2016-11-08 DIAGNOSIS — Z801 Family history of malignant neoplasm of trachea, bronchus and lung: Secondary | ICD-10-CM

## 2016-11-08 DIAGNOSIS — N179 Acute kidney failure, unspecified: Secondary | ICD-10-CM

## 2016-11-08 DIAGNOSIS — Z8673 Personal history of transient ischemic attack (TIA), and cerebral infarction without residual deficits: Secondary | ICD-10-CM

## 2016-11-08 DIAGNOSIS — R197 Diarrhea, unspecified: Secondary | ICD-10-CM | POA: Diagnosis present

## 2016-11-08 DIAGNOSIS — E871 Hypo-osmolality and hyponatremia: Secondary | ICD-10-CM | POA: Diagnosis present

## 2016-11-08 DIAGNOSIS — R64 Cachexia: Secondary | ICD-10-CM | POA: Diagnosis present

## 2016-11-08 DIAGNOSIS — R634 Abnormal weight loss: Secondary | ICD-10-CM

## 2016-11-08 DIAGNOSIS — C7889 Secondary malignant neoplasm of other digestive organs: Secondary | ICD-10-CM | POA: Diagnosis present

## 2016-11-08 DIAGNOSIS — K219 Gastro-esophageal reflux disease without esophagitis: Secondary | ICD-10-CM | POA: Diagnosis present

## 2016-11-08 DIAGNOSIS — E43 Unspecified severe protein-calorie malnutrition: Secondary | ICD-10-CM | POA: Diagnosis present

## 2016-11-08 DIAGNOSIS — Z681 Body mass index (BMI) 19 or less, adult: Secondary | ICD-10-CM

## 2016-11-08 DIAGNOSIS — Z8572 Personal history of non-Hodgkin lymphomas: Secondary | ICD-10-CM

## 2016-11-08 DIAGNOSIS — E785 Hyperlipidemia, unspecified: Secondary | ICD-10-CM | POA: Diagnosis present

## 2016-11-08 DIAGNOSIS — R590 Localized enlarged lymph nodes: Secondary | ICD-10-CM | POA: Diagnosis present

## 2016-11-08 DIAGNOSIS — E860 Dehydration: Secondary | ICD-10-CM | POA: Diagnosis present

## 2016-11-08 DIAGNOSIS — R011 Cardiac murmur, unspecified: Secondary | ICD-10-CM | POA: Diagnosis present

## 2016-11-08 DIAGNOSIS — C7800 Secondary malignant neoplasm of unspecified lung: Secondary | ICD-10-CM | POA: Diagnosis present

## 2016-11-08 DIAGNOSIS — K449 Diaphragmatic hernia without obstruction or gangrene: Secondary | ICD-10-CM | POA: Diagnosis present

## 2016-11-08 DIAGNOSIS — K222 Esophageal obstruction: Secondary | ICD-10-CM | POA: Diagnosis present

## 2016-11-08 HISTORY — DX: Non-Hodgkin lymphoma, unspecified, unspecified site: C85.90

## 2016-11-08 LAB — URINALYSIS, ROUTINE W REFLEX MICROSCOPIC
BILIRUBIN URINE: NEGATIVE
GLUCOSE, UA: NEGATIVE mg/dL
Hgb urine dipstick: NEGATIVE
KETONES UR: 5 mg/dL — AB
LEUKOCYTES UA: NEGATIVE
Nitrite: NEGATIVE
Protein, ur: NEGATIVE mg/dL
Specific Gravity, Urine: 1.013 (ref 1.005–1.030)
pH: 5 (ref 5.0–8.0)

## 2016-11-08 LAB — MAGNESIUM: Magnesium: 1.8 mg/dL (ref 1.7–2.4)

## 2016-11-08 LAB — CBC
HCT: 39.1 % (ref 39.0–52.0)
HEMATOCRIT: 38.5 % — AB (ref 39.0–52.0)
Hemoglobin: 12.9 g/dL — ABNORMAL LOW (ref 13.0–17.0)
Hemoglobin: 13.3 g/dL (ref 13.0–17.0)
MCH: 22.9 pg — ABNORMAL LOW (ref 26.0–34.0)
MCH: 24 pg — ABNORMAL LOW (ref 26.0–34.0)
MCHC: 33.5 g/dL (ref 30.0–36.0)
MCHC: 34 g/dL (ref 30.0–36.0)
MCV: 68.4 fL — AB (ref 78.0–100.0)
MCV: 70.6 fL — ABNORMAL LOW (ref 78.0–100.0)
PLATELETS: 236 10*3/uL (ref 150–400)
Platelets: 361 10*3/uL (ref 150–400)
RBC: 5.63 MIL/uL (ref 4.22–5.81)
RBC: 5.54 MIL/uL (ref 4.22–5.81)
RDW: 14.1 % (ref 11.5–15.5)
RDW: 14.3 % (ref 11.5–15.5)
WBC: 8.7 10*3/uL (ref 4.0–10.5)
WBC: 9.4 10*3/uL (ref 4.0–10.5)

## 2016-11-08 LAB — TSH: TSH: 5.424 u[IU]/mL — ABNORMAL HIGH (ref 0.350–4.500)

## 2016-11-08 LAB — COMPREHENSIVE METABOLIC PANEL
ALBUMIN: 4.5 g/dL (ref 3.5–5.0)
ALT: 17 U/L (ref 17–63)
AST: 50 U/L — AB (ref 15–41)
Alkaline Phosphatase: 69 U/L (ref 38–126)
Anion gap: 13 (ref 5–15)
BUN: 17 mg/dL (ref 6–20)
CHLORIDE: 94 mmol/L — AB (ref 101–111)
CO2: 26 mmol/L (ref 22–32)
Calcium: 13.4 mg/dL (ref 8.9–10.3)
Creatinine, Ser: 1.28 mg/dL — ABNORMAL HIGH (ref 0.61–1.24)
GFR calc Af Amer: >60 mL/min (ref >60)
GFR calc non Af Amer: 54 mL/min — ABNORMAL LOW (ref >60)
GLUCOSE: 120 mg/dL — AB (ref 65–99)
POTASSIUM: 4 mmol/L (ref 3.5–5.1)
Sodium: 133 mmol/L — ABNORMAL LOW (ref 135–145)
Total Bilirubin: 0.8 mg/dL (ref 0.3–1.2)
Total Protein: 8.5 g/dL — ABNORMAL HIGH (ref 6.5–8.1)

## 2016-11-08 LAB — CREATININE, SERUM
Creatinine, Ser: 1.22 mg/dL (ref 0.61–1.24)
GFR, EST NON AFRICAN AMERICAN: 57 mL/min — AB (ref >60)

## 2016-11-08 LAB — LIPASE, BLOOD: LIPASE: 31 U/L (ref 11–51)

## 2016-11-08 LAB — PHOSPHORUS: PHOSPHORUS: 2.5 mg/dL (ref 2.5–4.6)

## 2016-11-08 MED ORDER — SODIUM CHLORIDE 0.9 % IV SOLN
Freq: Once | INTRAVENOUS | Status: AC
Start: 2016-11-08 — End: 2016-11-08
  Administered 2016-11-08: 16:00:00 via INTRAVENOUS

## 2016-11-08 MED ORDER — SODIUM CHLORIDE 0.9 % IV BOLUS (SEPSIS)
1000.0000 mL | Freq: Once | INTRAVENOUS | Status: AC
  Administered 2016-11-08: 1000 mL via INTRAVENOUS

## 2016-11-08 MED ORDER — ONDANSETRON HCL 4 MG PO TABS: 4.0000 mg | ORAL_TABLET | Freq: Four times a day (QID) | ORAL | Status: DC | PRN

## 2016-11-08 MED ORDER — ACETAMINOPHEN 650 MG RE SUPP: 650.0000 mg | Freq: Four times a day (QID) | RECTAL | Status: DC | PRN

## 2016-11-08 MED ORDER — SIMVASTATIN 20 MG PO TABS
20.0000 mg | ORAL_TABLET | Freq: Every evening | ORAL | Status: DC
  Administered 2016-11-08 – 2016-11-11 (×4): 20 mg via ORAL
  Filled 2016-11-08 (×4): qty 1

## 2016-11-08 MED ORDER — ACETAMINOPHEN 325 MG PO TABS
650.0000 mg | ORAL_TABLET | Freq: Four times a day (QID) | ORAL | Status: DC | PRN
  Administered 2016-11-09 (×2): 650 mg via ORAL
  Filled 2016-11-08 (×2): qty 2

## 2016-11-08 MED ORDER — SODIUM CHLORIDE 0.9 % IV SOLN
INTRAVENOUS | Status: AC
  Administered 2016-11-09: 02:00:00 via INTRAVENOUS

## 2016-11-08 MED ORDER — CALCITONIN (SALMON) 200 UNIT/ML IJ SOLN
100.0000 [IU] | Freq: Once | INTRAMUSCULAR | Status: DC
Start: 2016-11-08 — End: 2016-11-08

## 2016-11-08 MED ORDER — HEPARIN SODIUM (PORCINE) 5000 UNIT/ML IJ SOLN
5000.0000 [IU] | Freq: Three times a day (TID) | INTRAMUSCULAR | Status: AC
  Administered 2016-11-08 – 2016-11-10 (×6): 5000 [IU] via SUBCUTANEOUS
  Filled 2016-11-08 (×6): qty 1

## 2016-11-08 MED ORDER — SODIUM CHLORIDE 0.9 % IV BOLUS (SEPSIS)
500.0000 mL | Freq: Once | INTRAVENOUS | Status: AC
  Administered 2016-11-08: 500 mL via INTRAVENOUS

## 2016-11-08 MED ORDER — POLYETHYLENE GLYCOL 3350 17 G PO PACK: 17.0000 g | PACK | Freq: Every day | ORAL | Status: DC | PRN

## 2016-11-08 MED ORDER — CARVEDILOL 12.5 MG PO TABS
12.5000 mg | ORAL_TABLET | Freq: Two times a day (BID) | ORAL | Status: DC
  Administered 2016-11-08 – 2016-11-12 (×7): 12.5 mg via ORAL
  Filled 2016-11-08 (×7): qty 1

## 2016-11-08 MED ORDER — CALCITONIN (SALMON) 200 UNIT/ML IJ SOLN
100.0000 [IU] | Freq: Once | INTRAMUSCULAR | Status: DC
  Filled 2016-11-08: qty 0.5

## 2016-11-08 MED ORDER — ONDANSETRON HCL 4 MG/2ML IJ SOLN: 4.0000 mg | Freq: Four times a day (QID) | INTRAMUSCULAR | Status: DC | PRN

## 2016-11-08 MED ORDER — CALCITONIN (SALMON) 200 UNIT/ML IJ SOLN
100.0000 [IU] | INTRAMUSCULAR | Status: AC
  Administered 2016-11-09 – 2016-11-10 (×3): 100 [IU] via SUBCUTANEOUS
  Filled 2016-11-08 (×3): qty 0.5

## 2016-11-08 NOTE — ED Notes (Signed)
Requested urine from patient and gave him a urinal

## 2016-11-08 NOTE — ED Notes (Signed)
ED Provider at bedside. 

## 2016-11-08 NOTE — ED Provider Notes (Addendum)
[facility] DEPT Provider Note   CSN: [id number] Arrival date & time: 11/08/16  1001     History   Chief Complaint Chief Complaint  Patient presents with  . Diarrhea    HPI Derek Stevens is a 76 y.o. male.  HPI Patient presents with decreased appetite, weight loss for the past few weeks. He's had increased fatigue. The last few days he's had a loose stool. States he's had incontinence of stool while he sleeps. Denies any blood or mucus in the stool. No fever or chills. Denies any abdominal pain. No recent antibiotics or hospitalizations. Past Medical History:  Diagnosis Date  . Asthma   . Cancer ([place])    lymphoma-1994  . GERD (gastroesophageal reflux disease)   . Hypertension   . Murmur, heart   . Stroke [facility]) 12/17/2010    Patient Active Problem List   Diagnosis Date Noted  . Hypercalcemia 11/08/2016  . Alcohol withdrawal delirium ([place]) 08/17/2015  . Alcohol abuse 08/17/2015  . Syncope 08/15/2015  . Syncopal episodes 08/15/2015    Past Surgical History:  Procedure Laterality Date  . ANGIOPLASTY         Home Medications    Prior to Admission medications   Medication Sig Start Date End Date Taking? Authorizing Provider  carvedilol (COREG) 12.5 MG tablet Take 12.5 mg by mouth 2 (two) times daily with a meal.   Yes Historical Provider, MD  lisinopril (PRINIVIL,ZESTRIL) 20 MG tablet Take 20 mg by mouth daily.   Yes Historical Provider, MD  simvastatin (ZOCOR) 20 MG tablet Take 20 mg by mouth every evening.  03/10/16  Yes Historical Provider, MD    Family History Family History  Problem Relation Age of Onset  . Lung cancer Father   . Lung cancer Brother   . Colon cancer Neg Hx   . Esophageal cancer Neg Hx   . Rectal cancer Neg Hx   . Stomach cancer Neg Hx     Social History Social History  Substance Use Topics  . Smoking status: Former Smoker    Quit date: 08/15/1971  . Smokeless tobacco: Current User  . Alcohol use No     Allergies     Patient has no known allergies.   Review of Systems Review of Systems  Constitutional: Positive for appetite change and fatigue. Negative for chills and fever.  Respiratory: Negative for shortness of breath.   Cardiovascular: Negative for chest pain.  Gastrointestinal: Positive for diarrhea. Negative for abdominal pain, blood in stool, constipation, nausea and vomiting.  Genitourinary: Negative for dysuria, flank pain and frequency.  Musculoskeletal: Negative for back pain, myalgias, neck pain and neck stiffness.  Skin: Negative for rash and wound.  Neurological: Negative for dizziness, weakness, light-headedness, numbness and headaches.  All other systems reviewed and are negative.    Physical Exam Updated Vital Signs BP (!) 138/92   Pulse 94   Temp 98.3 F (36.8 C) (Oral)   Resp 18   Ht 5\' 6"  (1.676 m)   Wt 150 lb (68 kg)   SpO2 96%   BMI 24.21 kg/m   Physical Exam  Constitutional: He is oriented to person, place, and time. He appears well-developed and well-nourished. No distress.  HENT:  Head: Normocephalic and atraumatic.  Mouth/Throat: Oropharynx is clear and moist.  Eyes: EOM are normal. Pupils are equal, round, and reactive to light.  Neck: Normal range of motion. Neck supple.  Cardiovascular: Regular rhythm.  Exam reveals no gallop and no friction rub.  No murmur heard. Tachycardia  Pulmonary/Chest: Effort normal and breath sounds normal.  Abdominal: Soft. Bowel sounds are normal. There is no tenderness. There is no rebound and no guarding.  Musculoskeletal: Normal range of motion. He exhibits no edema or tenderness.  No lower extremity swelling or asymmetry or tenderness. Distal pulses intact.  Neurological: He is alert and oriented to person, place, and time.  5/5 motor in all extremities. Sensation grossly intact.  Skin: Skin is warm and dry. Capillary refill takes less than 2 seconds. No rash noted. No erythema.  Psychiatric: He has a normal mood and  affect. His behavior is normal.  Nursing note and vitals reviewed.    ED Treatments / Results  Labs (all labs ordered are listed, but only abnormal results are displayed) Labs Reviewed  COMPREHENSIVE METABOLIC PANEL - Abnormal; Notable for the following:       Result Value   Sodium 133 (*)    Chloride 94 (*)    Glucose, Bld 120 (*)    Creatinine, Ser 1.28 (*)    Calcium 13.4 (*)    Total Protein 8.5 (*)    AST 50 (*)    GFR calc non Af Amer 54 (*)    All other components within normal limits  CBC - Abnormal; Notable for the following:    Hemoglobin 12.9 (*)    HCT 38.5 (*)    MCV 68.4 (*)    MCH 22.9 (*)    All other components within normal limits  URINALYSIS, ROUTINE W REFLEX MICROSCOPIC - Abnormal; Notable for the following:    Ketones, ur 5 (*)    All other components within normal limits  LIPASE, BLOOD  MAGNESIUM    EKG  EKG Interpretation  Date/Time:  Wednesday November 08 2016 12:04:11 EDT Ventricular Rate:  79 PR Interval:    QRS Duration: 97 QT Interval:  409 QTC Calculation: 469 R Axis:   62 Text Interpretation:  Sinus rhythm Atrial premature complexes ST elevation suggests acute pericarditis Confirmed by Lita Mains  MD, Joseantonio Dittmar ([id number]) on 11/08/2016 2:40:25 PM       Radiology No results found.  Procedures Procedures (including critical care time)  Medications Ordered in ED Medications  0.9 %  sodium chloride infusion (not administered)  sodium chloride 0.9 % bolus 500 mL (0 mLs Intravenous Stopped 11/08/16 1318)     Initial Impression / Assessment and Plan / ED Course  I have reviewed the triage vital signs and the nursing notes.  Pertinent labs & imaging results that were available during my care of the patient were reviewed by me and considered in my medical decision making (see chart for details).     Given IV fluids with improvement of tachycardia. Calcium is significantly elevated. This may be responsible for some of the patient's symptoms.  Discussed with hospitalist and will admit to observation.  Final Clinical Impressions(s) / ED Diagnoses   Final diagnoses:  Hypercalcemia  Dehydration  Diarrhea, unspecified type    New Prescriptions New Prescriptions   No medications on file     Julianne Rice, MD 11/08/16 1439    Julianne Rice, MD 11/08/16 1440

## 2016-11-08 NOTE — ED Triage Notes (Signed)
Patient reports diarrhea x10 days. Denies pain except when he "needs to use the bathroom."

## 2016-11-08 NOTE — ED Notes (Signed)
Serum calcium of 13.4 just given to Dr. Lita Mains.

## 2016-11-08 NOTE — ED Notes (Signed)
Patient made aware of needed urine sample. Patient reports he is unable to give a sample at this time.

## 2016-11-08 NOTE — ED Notes (Signed)
Called lab and they added the mag

## 2016-11-08 NOTE — H&P (Addendum)
History and Physical    Derek Stevens:[id number] DOB: Oct 02, 1940 DOA: 11/08/2016  PCP: Kristine Garbe, MD  Patient coming from: home  Chief Complaint: Anorexia and generalized weakness  HPI: Derek Stevens is a 76 y.o. male with medical history significant of hypertension dyslipidemia non-Hodgkin's lymphoma treated in 1994 who comes in for anorexia and generalized weakness for the past 2 weeks with diarrhea that started the day prior to admission. He relates he had his colonoscopy on 2014 showed 2 polyps (the pathology showed tubular adenoma) and diverticulosis. He has just no appetite. He denies any fall weakness, fever, chills, chest pain or shortness of breath.  ED Course:  Basic metabolic panel was done that show mild hyponatremia, mild acute renal failure (baseline creatinine less than 1), hemoglobin of 12 with an MCV of 68, urinalysis was negative and 12-lead EKG as below.  Review of Systems: As per HPI otherwise 10 point review of systems negative.    Past Medical History:  Diagnosis Date  . Asthma   . Cancer ([place])    lymphoma-1994  . GERD (gastroesophageal reflux disease)   . Hypertension   . Murmur, heart   . Non-Hodgkin lymphoma ([place])   . Stroke [facility]) 12/17/2010    Past Surgical History:  Procedure Laterality Date  . ANGIOPLASTY       reports that he quit smoking about 45 years ago. He uses smokeless tobacco. He reports that he does not drink alcohol or use drugs.  No Known Allergies  Family History  Problem Relation Age of Onset  . Lung cancer Father   . Lung cancer Brother   . Colon cancer Neg Hx   . Esophageal cancer Neg Hx   . Rectal cancer Neg Hx   . Stomach cancer Neg Hx    Unacceptable: Noncontributory, unremarkable, or negative. Acceptable: Family history reviewed and not pertinent (If you reviewed it)  Prior to Admission medications   Medication Sig Start Date End Date Taking? Authorizing Provider  carvedilol (COREG) 12.5 MG tablet Take  12.5 mg by mouth 2 (two) times daily with a meal.   Yes Historical Provider, MD  lisinopril (PRINIVIL,ZESTRIL) 20 MG tablet Take 20 mg by mouth daily.   Yes Historical Provider, MD  simvastatin (ZOCOR) 20 MG tablet Take 20 mg by mouth every evening.  03/10/16  Yes Historical Provider, MD    Physical Exam: Vitals:   11/08/16 1405 11/08/16 1430 11/08/16 1500 11/08/16 1516  BP: (!) 138/92 (!) 159/81  (!) 147/99  Pulse: 94 71  73  Resp: 18 16  18   Temp:    98.4 F (36.9 C)  TempSrc:    Oral  SpO2: 96% 95%  96%  Weight:   55 kg (121 lb 4.1 oz)   Height:   5\' 6"  (1.676 m)     Constitutional: NAD, calm, comfortable, Extremely thin mildly cachectic Vitals:   11/08/16 1405 11/08/16 1430 11/08/16 1500 11/08/16 1516  BP: (!) 138/92 (!) 159/81  (!) 147/99  Pulse: 94 71  73  Resp: 18 16  18   Temp:    98.4 F (36.9 C)  TempSrc:    Oral  SpO2: 96% 95%  96%  Weight:   55 kg (121 lb 4.1 oz)   Height:   5\' 6"  (1.676 m)    Eyes: PERRL, lids and conjunctivae normal ENMT: Mucous membranes are moist. Posterior pharynx clear of any exudate or lesions.Normal dentition.  Neck: normal, supple, no masses, no thyromegaly Respiratory: clear to auscultation  bilaterally, no wheezing, no crackles. Normal respiratory effort. No accessory muscle use.  Cardiovascular: Regular rate and rhythm, no murmurs / rubs / gallops. No extremity edema. 2+ pedal pulses. No carotid bruits.  Abdomen: no tenderness, no masses palpated. No hepatosplenomegaly. Bowel sounds positive.  Musculoskeletal: no clubbing / cyanosis. No joint deformity upper and lower extremities. Good ROM, no contractures. Normal muscle tone.  Skin: no rashes, lesions, ulcers. No induration Neurologic: CN 2-12 grossly intact. Sensation intact, DTR normal. Strength 5/5 in all 4.  Psychiatric: Normal judgment and insight. Alert and oriented x 3. Normal mood.     Labs on Admission: I have personally reviewed following labs and imaging  studies  CBC:  Recent Labs Lab 11/08/16 1020  WBC 8.7  HGB 12.9*  HCT 38.5*  MCV 68.4*  PLT 035   Basic Metabolic Panel:  Recent Labs Lab 11/08/16 1019 11/08/16 1020  NA  --  133*  K  --  4.0  CL  --  94*  CO2  --  26  GLUCOSE  --  120*  BUN  --  17  CREATININE  --  1.28*  CALCIUM  --  13.4*  MG 1.8  --    GFR: Estimated Creatinine Clearance: 40 mL/min (A) (by C-G formula based on SCr of 1.28 mg/dL (H)). Liver Function Tests:  Recent Labs Lab 11/08/16 1020  AST 50*  ALT 17  ALKPHOS 69  BILITOT 0.8  PROT 8.5*  ALBUMIN 4.5    Recent Labs Lab 11/08/16 1020  LIPASE 31   No results for input(s): AMMONIA in the last 168 hours. Coagulation Profile: No results for input(s): INR, PROTIME in the last 168 hours. Cardiac Enzymes: No results for input(s): CKTOTAL, CKMB, CKMBINDEX, TROPONINI in the last 168 hours. BNP (last 3 results) No results for input(s): PROBNP in the last 8760 hours. HbA1C: No results for input(s): HGBA1C in the last 72 hours. CBG: No results for input(s): GLUCAP in the last 168 hours. Lipid Profile: No results for input(s): CHOL, HDL, LDLCALC, TRIG, CHOLHDL, LDLDIRECT in the last 72 hours. Thyroid Function Tests: No results for input(s): TSH, T4TOTAL, FREET4, T3FREE, THYROIDAB in the last 72 hours. Anemia Panel: No results for input(s): VITAMINB12, FOLATE, FERRITIN, TIBC, IRON, RETICCTPCT in the last 72 hours. Urine analysis:    Component Value Date/Time   COLORURINE YELLOW 11/08/2016 1310   APPEARANCEUR CLEAR 11/08/2016 1310   LABSPEC 1.013 11/08/2016 1310   PHURINE 5.0 11/08/2016 1310   GLUCOSEU NEGATIVE 11/08/2016 1310   HGBUR NEGATIVE 11/08/2016 1310   BILIRUBINUR NEGATIVE 11/08/2016 1310   KETONESUR 5 (A) 11/08/2016 1310   PROTEINUR NEGATIVE 11/08/2016 1310   UROBILINOGEN 0.2 02/10/2011 1249   NITRITE NEGATIVE 11/08/2016 1310   LEUKOCYTESUR NEGATIVE 11/08/2016 1310    Radiological Exams on Admission: No results  found.  EKG: Independently reviewed. Sinus rhythm normal axis no T wave abnormalities.  Assessment/Plan Anorexia/  Weight loss/ Hypercalcemia Etiology unclear of his hypercalcemia, I am concerned about malignancy, due to his elevated calcium his sinus symptoms with anorexia and weight loss over the last several months. Start him on calcitonin IV fluids recheck a basic metabolic panel in the morning. We'll check a TSH, PTH phosphorus and vitamin D. CT scan of the chest abdomen and pelvis  AKI (acute kidney injury) ([place]): Hypercalcemia start him on aggressive IV fluids and recheck in the morning.  Essential hypertension Continue current home regimen except diuretic.  Diarrhea: None he's been here. We'll continue to monitor.  DVT  prophylaxis: heparin Code Status: full Family Communication: none Consults called: None Admission status: observation   Charlynne Cousins MD [facility] Pager [phone]  If 7PM-7AM, please contact night-coverage [email or web address] Password [facility]  11/08/2016, 3:54 PM

## 2016-11-08 NOTE — Progress Notes (Signed)
Pt up and down to Derek Stevens, only passing gas with smear of stool,.  Attempted to reorient numerous times and ask him to call for help. Patient pleasant but continues to get up to go to the bathroom  without assistance, high fall risk.

## 2016-11-09 ENCOUNTER — Observation Stay (HOSPITAL_COMMUNITY): Payer: Medicare HMO

## 2016-11-09 DIAGNOSIS — C159 Malignant neoplasm of esophagus, unspecified: Secondary | ICD-10-CM | POA: Diagnosis not present

## 2016-11-09 DIAGNOSIS — I10 Essential (primary) hypertension: Secondary | ICD-10-CM | POA: Diagnosis not present

## 2016-11-09 DIAGNOSIS — N179 Acute kidney failure, unspecified: Secondary | ICD-10-CM | POA: Diagnosis not present

## 2016-11-09 DIAGNOSIS — E43 Unspecified severe protein-calorie malnutrition: Secondary | ICD-10-CM | POA: Diagnosis not present

## 2016-11-09 DIAGNOSIS — E860 Dehydration: Secondary | ICD-10-CM | POA: Diagnosis not present

## 2016-11-09 DIAGNOSIS — R197 Diarrhea, unspecified: Secondary | ICD-10-CM | POA: Diagnosis not present

## 2016-11-09 DIAGNOSIS — K229 Disease of esophagus, unspecified: Secondary | ICD-10-CM | POA: Diagnosis not present

## 2016-11-09 LAB — BASIC METABOLIC PANEL
Anion gap: 10 (ref 5–15)
BUN: 13 mg/dL (ref 6–20)
CHLORIDE: 101 mmol/L (ref 101–111)
CO2: 27 mmol/L (ref 22–32)
CREATININE: 0.97 mg/dL (ref 0.61–1.24)
Calcium: 11.7 mg/dL — ABNORMAL HIGH (ref 8.9–10.3)
GFR calc Af Amer: >60 mL/min (ref >60)
GFR calc non Af Amer: >60 mL/min (ref >60)
GLUCOSE: 116 mg/dL — AB (ref 65–99)
Potassium: 3.8 mmol/L (ref 3.5–5.1)
Sodium: 138 mmol/L (ref 135–145)

## 2016-11-09 LAB — VITAMIN D 25 HYDROXY (VIT D DEFICIENCY, FRACTURES): Vit D, 25-Hydroxy: 18.8 ng/mL — ABNORMAL LOW (ref 30.0–100.0)

## 2016-11-09 MED ORDER — ENSURE ENLIVE PO LIQD
237.0000 mL | Freq: Two times a day (BID) | ORAL | Status: DC
  Administered 2016-11-09 – 2016-11-12 (×4): 237 mL via ORAL

## 2016-11-09 MED ORDER — IOPAMIDOL (ISOVUE-300) INJECTION 61%: 30.0000 mL | Freq: Once | INTRAVENOUS | Status: DC | PRN

## 2016-11-09 MED ORDER — IOPAMIDOL (ISOVUE-300) INJECTION 61%
INTRAVENOUS | Status: AC
  Administered 2016-11-09: 15 mL
  Filled 2016-11-09: qty 30

## 2016-11-09 MED ORDER — ADULT MULTIVITAMIN W/MINERALS CH
1.0000 | ORAL_TABLET | Freq: Every day | ORAL | Status: DC
  Administered 2016-11-09 – 2016-11-10 (×2): 1 via ORAL
  Filled 2016-11-09 (×3): qty 1

## 2016-11-09 MED ORDER — IOPAMIDOL (ISOVUE-300) INJECTION 61%
INTRAVENOUS | Status: AC
  Filled 2016-11-09: qty 100

## 2016-11-09 MED ORDER — BOOST / RESOURCE BREEZE PO LIQD
1.0000 | ORAL | Status: DC
  Administered 2016-11-10 – 2016-11-12 (×3): 1 via ORAL

## 2016-11-09 MED ORDER — SODIUM CHLORIDE 0.9 % IV SOLN
INTRAVENOUS | Status: DC
  Administered 2016-11-10 – 2016-11-11 (×3): via INTRAVENOUS
  Administered 2016-11-11: 500 mL via INTRAVENOUS

## 2016-11-09 MED ORDER — IOPAMIDOL (ISOVUE-300) INJECTION 61%
100.0000 mL | Freq: Once | INTRAVENOUS | Status: AC | PRN
  Administered 2016-11-09: 80 mL via INTRAVENOUS

## 2016-11-09 MED ORDER — MINERAL OIL RE ENEM
1.0000 | ENEMA | Freq: Once | RECTAL | Status: AC
  Administered 2016-11-09: 1 via RECTAL
  Filled 2016-11-09: qty 1

## 2016-11-09 NOTE — Progress Notes (Signed)
Initial Nutrition Assessment  DOCUMENTATION CODES:   Severe malnutrition in context of acute illness/injury  INTERVENTION:  -Will order Ensure Enlive BID, each supplement provides 350 kcal and 20 grams of protein - Will order Boost Breeze once/day, each supplement provides 250 kcal and 9 grams of protein - Will order daily multivitamin with minerals. - RD will continue to monitor for additional needs.  NUTRITION DIAGNOSIS:   Malnutrition (severe ) related to poor appetite, acute illness (hypercalcemia) as evidenced by severe depletion of body fat, severe depletion of muscle mass.  GOAL:   Patient will meet greater than or equal to 90% of their needs  MONITOR:   PO intake, Supplement acceptance, Weight trends, Labs  REASON FOR ASSESSMENT:   Malnutrition Screening Tool  ASSESSMENT:   76 y.o. male with medical history significant of hypertension dyslipidemia non-Hodgkin's lymphoma treated in 1994 who comes in for anorexia and generalized weakness for the past 2 weeks with diarrhea that started the day prior to admission. He relates he had his colonoscopy on 2014 showed 2 polyps (the pathology showed tubular adenoma) and diverticulosis. He has just no appetite. He denies any fall weakness, fever, chills, chest pain or shortness of breath.  Pt seen for MST. BMI indicates normal weight. No intakes documented since admission. It was difficult to obtain information from pt. No family/visitors present. Pt talks about ongoing diarrhea PTA. He states that his sister-in-law often cooked meals and that she cooked in large amounts of oil/fat which caused upset stomach and loose stools. Because of this, pt did not want to eat and would say he was not hungry rather than offending her and not eating the items she had prepared. When RD asked about PO intakes since hospitalization, he continues to discuss how much oil his sister-in-law used and how he is not accustomed to eating foods prepared this way.  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   Physical assessment shows severe muscle and severe fat wasting to upper body; lower body not assessed at this time as pt states he thinks he may have to use the restroom again.  Per chart review, pt has lost 29 lbs (19.3% body weight) in the past 6 months which is significant for time frame. No weight available between September 2017 and current weight.   Notes indicate that pt had previously reported ongoing diarrhea x10 days PTA and that he had a poor appetite and poor intakes x2 weeks PTA. Notes also mention concern for malignancy; will continue to monitor.  Medications reviewed.  Labs reviewed; Ca: 11.7 mg/dL.  IVF: NS @ 100 mL/hr.    Diet Order:  Diet regular Room service appropriate? Yes; Fluid consistency: Thin  Skin:  Reviewed, no issues  Last BM:  3/29  Height:   Ht Readings from Last 1 Encounters:  11/08/16 5\' 6"  (1.676 m)    Weight:   Wt Readings from Last 1 Encounters:  11/08/16 121 lb 4.1 oz (55 kg)    Ideal Body Weight:  64.54 kg  BMI:  Body mass index is 19.57 kg/m.  Estimated Nutritional Needs:   Kcal:  1540-1760 (28-32 kcal/kg)  Protein:  65-78 grams (1.2-1.4 grams/kg)  Fluid:  >/= 1.7 L/day  EDUCATION NEEDS:   No education needs identified at this time    Jarome Matin, MS, RD, LDN, [facility]  Clinical Dietitian Pager # [phone] After hours/weekend pager # [phone]

## 2016-11-09 NOTE — Care Management Obs Status (Signed)
[place] NOTIFICATION   Patient Details  Name: Derek Stevens MRN: [id number] Date of Birth: 09-Jun-1941   Medicare Observation Status Notification Given:  Yes    MahabirJuliann Pulse, RN 11/09/2016, 1:05 PM

## 2016-11-09 NOTE — Care Management Note (Signed)
Case Management Note  Patient Details  Name: CAROLD EISNER MRN: [id number] Date of Birth: March 22, 1941  Subjective/Objective:  76 y/o m admitted w/hypercalcemia. From home w/spouse. PT cons-await recc.                  Action/Plan:d/c plan home.   Expected Discharge Date:                  Expected Discharge Plan:  Home/Self Care  In-House Referral:     Discharge planning Services  CM Consult  Post Acute Care Choice:    Choice offered to:     DME Arranged:    DME Agency:     HH Arranged:    HH Agency:     Status of Service:  In process, will continue to follow  If discussed at Long Length of Stay Meetings, dates discussed:    Additional Comments:  Dessa Phi, RN 11/09/2016, 1:06 PM

## 2016-11-09 NOTE — Progress Notes (Addendum)
PROGRESS NOTE    Derek Stevens  OMV:[id number] DOB: [birth year] DOA: 11/08/2016 PCP: Kristine Garbe, MD    Brief Narrative:  76 yo male presented with anorexia and generalized weakness, history of non Hodgkin's lymphoma in 1994. Positive diarrhea and weakness for the last 2 weeks before hospitalization. On admission hemodynamically stable, moist mucous membranes, non focal. Serum calcium elevated up to 13.4. Started on IV fluids and calcitonin, follow on ct chest, abdomen and pelvis.  Assessment & Plan:   Active Problems:   Hypercalcemia   Anorexia   Weight loss   AKI (acute kidney injury) ([place])   Essential hypertension   1. Hypercalcemia. Serum ca trending down, will continue saline IV at 100 ml/ hr and calcitonin, improved renal function and patient's weakness, will follow on chest CT, abdomen and pelvis. Follow on pth, vitamin d levels and pth related peptide.   2. AKI. Renal function with cr down to 0.97 with K at 3,8 and Na at 138, Cl 101. Will continue on saline IV, will order strict in and out to follow on urine output.     3. HTN. Systolic blood pressure 628 to 366 systolic, will continue blood pressure control with coreg.   4. Diarrhea. Will continue close observation. Hydration with IV fluids.   5. Malnutrition. Will follow nutrition recommendations.   DVT prophylaxis: heparin  Code Status: full  Family Communication: No family at the bedside  Disposition Plan: home .   Consultants:     Procedures:   Antimicrobials:   Subjective: Patient with no chest pain, no nausea or vomiting. At home had diarrhea.   Objective: Vitals:   11/08/16 2004 11/09/16 0447 11/09/16 0744 11/09/16 1344  BP:  (!) 162/86 (!) 145/82 (!) 146/76  Pulse:  80 67 (!) 56  Resp: 17 18 18 16   Temp: 97.8 F (36.6 C) 98 F (36.7 C) 98.2 F (36.8 C) 98 F (36.7 C)  TempSrc: Oral Oral Oral Oral  SpO2: 100% 96% 100% 100%  Weight:      Height:        Intake/Output Summary (Last 24  hours) at 11/09/16 1422 Last data filed at 11/09/16 0504  Gross per 24 hour  Intake          1841.67 ml  Output              354 ml  Net          1487.67 ml   Filed Weights   11/08/16 1007 11/08/16 1500  Weight: 68 kg (150 lb) 55 kg (121 lb 4.1 oz)    Examination:  General exam: deconditioned E ENT: Mild pallor no icterus, oral mucosa moist.  Respiratory system: Clear to auscultation. Respiratory effort normal. No wheezing, rales or rhonchi.  Cardiovascular system: S1 & S2 heard, RRR. No JVD, murmurs, rubs, gallops or clicks. No pedal edema. Gastrointestinal system: Abdomen is nondistended, soft and nontender. No organomegaly or masses felt. Normal bowel sounds heard. Central nervous system: Alert and oriented. No focal neurological deficits. Extremities: Symmetric 5 x 5 power. Skin: No rashes, lesions or ulcers   Data Reviewed: I have personally reviewed following labs and imaging studies  CBC:  Recent Labs Lab 11/08/16 1020 11/08/16 1822  WBC 8.7 9.4  HGB 12.9* 13.3  HCT 38.5* 39.1  MCV 68.4* 70.6*  PLT 361 294   Basic Metabolic Panel:  Recent Labs Lab 11/08/16 1019 11/08/16 1020 11/08/16 1630 11/09/16 0453  NA  --  133*  --  138  K  --  4.0  --  3.8  CL  --  94*  --  101  CO2  --  26  --  27  GLUCOSE  --  120*  --  116*  BUN  --  17  --  13  CREATININE  --  1.28* 1.22 0.97  CALCIUM  --  13.4*  --  11.7*  MG 1.8  --   --   --   PHOS  --   --  2.5  --    GFR: Estimated Creatinine Clearance: 52.8 mL/min (by C-G formula based on SCr of 0.97 mg/dL). Liver Function Tests:  Recent Labs Lab 11/08/16 1020  AST 50*  ALT 17  ALKPHOS 69  BILITOT 0.8  PROT 8.5*  ALBUMIN 4.5    Recent Labs Lab 11/08/16 1020  LIPASE 31   No results for input(s): AMMONIA in the last 168 hours. Coagulation Profile: No results for input(s): INR, PROTIME in the last 168 hours. Cardiac Enzymes: No results for input(s): CKTOTAL, CKMB, CKMBINDEX, TROPONINI in the last 168  hours. BNP (last 3 results) No results for input(s): PROBNP in the last 8760 hours. HbA1C: No results for input(s): HGBA1C in the last 72 hours. CBG: No results for input(s): GLUCAP in the last 168 hours. Lipid Profile: No results for input(s): CHOL, HDL, LDLCALC, TRIG, CHOLHDL, LDLDIRECT in the last 72 hours. Thyroid Function Tests:  Recent Labs  11/08/16 1630  TSH 5.424*   Anemia Panel: No results for input(s): VITAMINB12, FOLATE, FERRITIN, TIBC, IRON, RETICCTPCT in the last 72 hours. Sepsis Labs: No results for input(s): PROCALCITON, LATICACIDVEN in the last 168 hours.  No results found for this or any previous visit (from the past 240 hour(s)).       Radiology Studies: Ct Chest W Contrast  Result Date: 11/09/2016 CLINICAL DATA:  76 year old male with history of anorexia and generalized weakness for the past 2 weeks. Diarrhea for 24 hours. Remote history of non-Hodgkin's lymphoma treated in 1994. EXAM: CT CHEST, ABDOMEN, AND PELVIS WITH CONTRAST TECHNIQUE: Multidetector CT imaging of the chest, abdomen and pelvis was performed following the standard protocol during bolus administration of intravenous contrast. CONTRAST:  21mL ISOVUE-300 IOPAMIDOL (ISOVUE-300) INJECTION 61% COMPARISON:  No priors. FINDINGS: CT CHEST FINDINGS Cardiovascular: Heart size is normal. There is no significant pericardial fluid, thickening or pericardial calcification. There is aortic atherosclerosis, as well as atherosclerosis of the great vessels of the mediastinum and the coronary arteries, including calcified atherosclerotic plaque in the left anterior descending coronary artery. Mediastinum/Nodes: Right infrahilar lymphadenopathy measuring up to 16 mm in short axis. Conglomerate nodal mass in the right hilar region measuring 2.3 x 3.4 cm (axial image 29 of series 2). Multiple other enlarged mediastinal lymph nodes, most notable for a paraesophageal lymph node in the inferior aspect of the middle  mediastinum measuring 2.2 x 3.8 cm. Mass-like thickening of the esophageal wall at the junction of proximal to middle third of the esophagus, best appreciated on axial image 18 of series 2 and coronal image 60 of series 7. No axillary lymphadenopathy. Lungs/Pleura: There are innumerable pulmonary nodules scattered throughout the lungs bilaterally, with mid to lower lung predominance, highly concerning for widespread metastatic disease. These nodules measure up to 12 mm in diameter in the lower lobes of the lungs bilaterally (axial image 123 of series 6 in the right lower lobe and axial image 120 of series 6 in the left lower lobe). No acute consolidative airspace disease. No pleural effusions.  Musculoskeletal: There are no aggressive appearing lytic or blastic lesions noted in the visualized portions of the skeleton. CT ABDOMEN PELVIS FINDINGS Hepatobiliary: There are several hypovascular masses in the liver, largest of which are centered in segment 1 (axial image 54 of series 2) and in segment 2 (axial image 54 of series 2), measuring 4.2 x 5.2 cm and 3.9 x 5.4 cm respectively. No intra or extrahepatic biliary ductal dilatation. Gallbladder is normal in appearance. Pancreas: No pancreatic mass. No pancreatic ductal dilatation. No pancreatic or peripancreatic fluid or inflammatory changes. Spleen: Unremarkable. Adrenals/Urinary Tract: Bilateral adrenal glands and bilateral kidneys are normal in appearance. No hydroureteronephrosis. Urinary bladder is normal in appearance. Stomach/Bowel: The appearance of the stomach is normal. No pathologic dilatation of small bowel or colon. The appendix is not confidently identified and may be surgically absent. Regardless, there are no inflammatory changes noted adjacent to the cecum to suggest the presence of an acute appendicitis at this time. Vascular/Lymphatic: Aortic atherosclerosis, without evidence of aneurysm or dissection in the abdominal or pelvic vasculature. No  lymphadenopathy noted in the abdomen or pelvis. Reproductive: Prostate gland and seminal vesicles are unremarkable in appearance. Other: No significant volume of ascites.  No pneumoperitoneum. Musculoskeletal: There are no aggressive appearing lytic or blastic lesions noted in the visualized portions of the skeleton. IMPRESSION: 1. 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 The exact source of primary lung malignancy is uncertain. 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 Differential considerations would also include a primary small cell carcinoma of the lung. Further evaluation with endoscopy and PET-CT is recommended for diagnostic and staging purposes. 2. Aortic atherosclerosis, in addition to left anterior descending coronary artery disease. Assessment for potential risk factor modification, dietary therapy or pharmacologic therapy may be warranted, if clinically indicated. 3. Additional incidental findings, as above. Electronically Signed   By: Vinnie Langton M.D.   On: 11/09/2016 11:15   Ct Abdomen Pelvis W Contrast  Result Date: 11/09/2016 CLINICAL DATA:  76 year old male with history of anorexia and generalized weakness for the past 2 weeks. Diarrhea for 24 hours. Remote history of non-Hodgkin's lymphoma treated in 1994. EXAM: CT CHEST, ABDOMEN, AND PELVIS WITH CONTRAST TECHNIQUE: Multidetector CT imaging of the chest, abdomen and pelvis was performed following the standard protocol during bolus administration of intravenous contrast. CONTRAST:  92mL ISOVUE-300 IOPAMIDOL (ISOVUE-300) INJECTION 61% COMPARISON:  No priors. FINDINGS: CT CHEST FINDINGS Cardiovascular: Heart size is normal. There is no significant pericardial fluid, thickening or pericardial  calcification. There is aortic atherosclerosis, as well as atherosclerosis of the great vessels of the mediastinum and the coronary arteries, including calcified atherosclerotic plaque in the left anterior descending coronary artery. Mediastinum/Nodes: Right infrahilar lymphadenopathy measuring up to 16 mm in short axis. Conglomerate nodal mass in the right hilar region measuring 2.3 x 3.4 cm (axial image 29 of series 2). Multiple other enlarged mediastinal lymph nodes, most notable for a paraesophageal lymph node in the inferior aspect of the middle mediastinum measuring 2.2 x 3.8 cm. Mass-like thickening of the esophageal wall at the junction of proximal to middle third of the esophagus, best appreciated on axial image 18 of series 2 and coronal image 60 of series 7. No axillary lymphadenopathy. Lungs/Pleura: There are innumerable pulmonary nodules scattered throughout the lungs bilaterally, with  mid to lower lung predominance, highly concerning for widespread metastatic disease. These nodules measure up to 12 mm in diameter in the lower lobes of the lungs bilaterally (axial image 123 of series 6 in the right lower lobe and axial image 120 of series 6 in the left lower lobe). No acute consolidative airspace disease. No pleural effusions. Musculoskeletal: There are no aggressive appearing lytic or blastic lesions noted in the visualized portions of the skeleton. CT ABDOMEN PELVIS FINDINGS Hepatobiliary: There are several hypovascular masses in the liver, largest of which are centered in segment 1 (axial image 54 of series 2) and in segment 2 (axial image 54 of series 2), measuring 4.2 x 5.2 cm and 3.9 x 5.4 cm respectively. No intra or extrahepatic biliary ductal dilatation. Gallbladder is normal in appearance. Pancreas: No pancreatic mass. No pancreatic ductal dilatation. No pancreatic or peripancreatic fluid or inflammatory changes. Spleen: Unremarkable. Adrenals/Urinary Tract: Bilateral adrenal glands and  bilateral kidneys are normal in appearance. No hydroureteronephrosis. Urinary bladder is normal in appearance. Stomach/Bowel: The appearance of the stomach is normal. No pathologic dilatation of small bowel or colon. The appendix is not confidently identified and may be surgically absent. Regardless, there are no inflammatory changes noted adjacent to the cecum to suggest the presence of an acute appendicitis at this time. Vascular/Lymphatic: Aortic atherosclerosis, without evidence of aneurysm or dissection in the abdominal or pelvic vasculature. No lymphadenopathy noted in the abdomen or pelvis. Reproductive: Prostate gland and seminal vesicles are unremarkable in appearance. Other: No significant volume of ascites.  No pneumoperitoneum. Musculoskeletal: There are no aggressive appearing lytic or blastic lesions noted in the visualized portions of the skeleton. IMPRESSION: 1. 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 The exact source of primary lung malignancy is uncertain. 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 Differential considerations would also include a primary small cell carcinoma of the lung. Further evaluation with endoscopy and PET-CT is recommended for diagnostic and staging purposes. 2. Aortic atherosclerosis, in addition to left anterior descending coronary artery disease. Assessment for potential risk factor modification, dietary therapy or pharmacologic therapy may be warranted, if clinically indicated. 3. Additional incidental findings, as above. Electronically Signed   By: Vinnie Langton M.D.   On: 11/09/2016 11:15   Dg Abd Portable 1v  Result Date: 11/09/2016 CLINICAL DATA:  Diarrhea. EXAM: PORTABLE ABDOMEN - 1 VIEW COMPARISON:  Bowel gas pattern  from lumbar spine radiographs 08/15/2015 FINDINGS: No bowel dilatation to suggest obstruction. Air throughout small bowel and transverse colon in the upper abdomen, nondistended. A stool ball distends the rectum spanning 8.3 cm. No evidence of free air. A calcification to the right of L5 may be vascular. Pelvic phleboliths are noted. No acute osseous abnormalities are seen. IMPRESSION: No evidence of bowel obstruction. Increased air within nondistended small bowel and transverse colon in the upper abdomen is nonspecific, may be normal for this patient or can be seen with represent enteritis. Stool ball distends the rectum. Electronically Signed   By: Jeb Levering M.D.   On: 11/09/2016 02:36        Scheduled Meds: . calcitonin  100 Units Subcutaneous Q24H  . carvedilol  12.5 mg Oral BID WC  . feeding supplement  1 Container Oral Q24H  . feeding supplement (ENSURE ENLIVE)  237 mL Oral BID BM  . heparin  5,000 Units Subcutaneous Q8H  . iopamidol      . multivitamin with minerals  1 tablet Oral Daily  . simvastatin  20 mg Oral QPM   Continuous Infusions: . sodium chloride 100 mL/hr at 11/09/16 0223     LOS: 0 days       Tawni Millers, MD [facility] Pager [phone]  If 7PM-7AM, please contact night-coverage [email or web address] Password [email or web address] 11/09/2016, 2:22 PM

## 2016-11-10 DIAGNOSIS — Z79899 Other long term (current) drug therapy: Secondary | ICD-10-CM | POA: Diagnosis not present

## 2016-11-10 DIAGNOSIS — R64 Cachexia: Secondary | ICD-10-CM | POA: Diagnosis present

## 2016-11-10 DIAGNOSIS — C7889 Secondary malignant neoplasm of other digestive organs: Secondary | ICD-10-CM | POA: Diagnosis present

## 2016-11-10 DIAGNOSIS — R634 Abnormal weight loss: Secondary | ICD-10-CM | POA: Diagnosis not present

## 2016-11-10 DIAGNOSIS — E871 Hypo-osmolality and hyponatremia: Secondary | ICD-10-CM | POA: Diagnosis present

## 2016-11-10 DIAGNOSIS — Z8572 Personal history of non-Hodgkin lymphomas: Secondary | ICD-10-CM | POA: Diagnosis not present

## 2016-11-10 DIAGNOSIS — K222 Esophageal obstruction: Secondary | ICD-10-CM | POA: Diagnosis present

## 2016-11-10 DIAGNOSIS — C7800 Secondary malignant neoplasm of unspecified lung: Secondary | ICD-10-CM | POA: Diagnosis present

## 2016-11-10 DIAGNOSIS — E785 Hyperlipidemia, unspecified: Secondary | ICD-10-CM | POA: Diagnosis present

## 2016-11-10 DIAGNOSIS — J45909 Unspecified asthma, uncomplicated: Secondary | ICD-10-CM | POA: Diagnosis present

## 2016-11-10 DIAGNOSIS — K219 Gastro-esophageal reflux disease without esophagitis: Secondary | ICD-10-CM | POA: Diagnosis present

## 2016-11-10 DIAGNOSIS — N179 Acute kidney failure, unspecified: Secondary | ICD-10-CM | POA: Diagnosis present

## 2016-11-10 DIAGNOSIS — R630 Anorexia: Secondary | ICD-10-CM | POA: Diagnosis not present

## 2016-11-10 DIAGNOSIS — C787 Secondary malignant neoplasm of liver and intrahepatic bile duct: Secondary | ICD-10-CM | POA: Diagnosis present

## 2016-11-10 DIAGNOSIS — R011 Cardiac murmur, unspecified: Secondary | ICD-10-CM | POA: Diagnosis present

## 2016-11-10 DIAGNOSIS — R933 Abnormal findings on diagnostic imaging of other parts of digestive tract: Secondary | ICD-10-CM | POA: Diagnosis not present

## 2016-11-10 DIAGNOSIS — R590 Localized enlarged lymph nodes: Secondary | ICD-10-CM | POA: Diagnosis present

## 2016-11-10 DIAGNOSIS — F17220 Nicotine dependence, chewing tobacco, uncomplicated: Secondary | ICD-10-CM | POA: Diagnosis present

## 2016-11-10 DIAGNOSIS — I10 Essential (primary) hypertension: Secondary | ICD-10-CM | POA: Diagnosis present

## 2016-11-10 DIAGNOSIS — K229 Disease of esophagus, unspecified: Secondary | ICD-10-CM | POA: Diagnosis not present

## 2016-11-10 DIAGNOSIS — E860 Dehydration: Secondary | ICD-10-CM | POA: Diagnosis present

## 2016-11-10 DIAGNOSIS — K449 Diaphragmatic hernia without obstruction or gangrene: Secondary | ICD-10-CM | POA: Diagnosis present

## 2016-11-10 DIAGNOSIS — Z681 Body mass index (BMI) 19 or less, adult: Secondary | ICD-10-CM | POA: Diagnosis not present

## 2016-11-10 DIAGNOSIS — R197 Diarrhea, unspecified: Secondary | ICD-10-CM | POA: Diagnosis present

## 2016-11-10 DIAGNOSIS — Z801 Family history of malignant neoplasm of trachea, bronchus and lung: Secondary | ICD-10-CM | POA: Diagnosis not present

## 2016-11-10 DIAGNOSIS — E43 Unspecified severe protein-calorie malnutrition: Secondary | ICD-10-CM | POA: Diagnosis present

## 2016-11-10 DIAGNOSIS — Z8673 Personal history of transient ischemic attack (TIA), and cerebral infarction without residual deficits: Secondary | ICD-10-CM | POA: Diagnosis not present

## 2016-11-10 LAB — BASIC METABOLIC PANEL
ANION GAP: 8 (ref 5–15)
BUN: 8 mg/dL (ref 6–20)
CHLORIDE: 101 mmol/L (ref 101–111)
CO2: 27 mmol/L (ref 22–32)
Calcium: 10.1 mg/dL (ref 8.9–10.3)
Creatinine, Ser: 0.83 mg/dL (ref 0.61–1.24)
Glucose, Bld: 113 mg/dL — ABNORMAL HIGH (ref 65–99)
POTASSIUM: 3.2 mmol/L — AB (ref 3.5–5.1)
SODIUM: 136 mmol/L (ref 135–145)

## 2016-11-10 LAB — CBC WITH DIFFERENTIAL/PLATELET
BASOS PCT: 0 %
Basophils Absolute: 0 10*3/uL (ref 0.0–0.1)
EOS ABS: 0.1 10*3/uL (ref 0.0–0.7)
Eosinophils Relative: 1 %
HCT: 33.2 % — ABNORMAL LOW (ref 39.0–52.0)
HEMOGLOBIN: 11.2 g/dL — AB (ref 13.0–17.0)
LYMPHS PCT: 19 %
Lymphs Abs: 1.3 10*3/uL (ref 0.7–4.0)
MCH: 23.3 pg — AB (ref 26.0–34.0)
MCHC: 33.7 g/dL (ref 30.0–36.0)
MCV: 69.2 fL — ABNORMAL LOW (ref 78.0–100.0)
Monocytes Absolute: 0.6 10*3/uL (ref 0.1–1.0)
Monocytes Relative: 9 %
NEUTROS PCT: 71 %
Neutro Abs: 4.9 10*3/uL (ref 1.7–7.7)
PLATELETS: 301 10*3/uL (ref 150–400)
RBC: 4.8 MIL/uL (ref 4.22–5.81)
RDW: 14 % (ref 11.5–15.5)
WBC: 6.9 10*3/uL (ref 4.0–10.5)

## 2016-11-10 LAB — PARATHYROID HORMONE, INTACT (NO CA): PTH: 36 pg/mL (ref 15–65)

## 2016-11-10 MED ORDER — POTASSIUM CHLORIDE CRYS ER 20 MEQ PO TBCR
40.0000 meq | EXTENDED_RELEASE_TABLET | ORAL | Status: AC
  Administered 2016-11-10 (×2): 40 meq via ORAL
  Filled 2016-11-10 (×2): qty 2

## 2016-11-10 NOTE — Evaluation (Signed)
Physical Therapy Evaluation Patient Details Name: Derek Stevens MRN: [id number] DOB: 1940/11/01 Today's Date: 11/10/2016   History of Present Illness  76 yo male admitted with hypercalcemia, anorexia, weight loss. Hx of HTN, NHL, CVA, asthma, ETOH WD delirium, ETOH abuse, fall.     Clinical Impression  On eval, pt required Min guard assist for mobility. He walked ~250 feet. Unsteady at times but no overt LOB. No family present during session. Will follow during hospital stay.     Follow Up Recommendations Supervision/Assistance - 24 hour;Home health PT    Equipment Recommendations  None recommended by PT    Recommendations for Other Services       Precautions / Restrictions Precautions Precautions: Fall Restrictions Weight Bearing Restrictions: No      Mobility  Bed Mobility Overal bed mobility: Modified Independent                Transfers Overall transfer level: Modified independent                  Ambulation/Gait Ambulation/Gait assistance: Min guard Ambulation Distance (Feet): 125 Feet (x2) Assistive device: None (IV pole) Gait Pattern/deviations: Step-through pattern;Decreased dorsiflexion - right;Drifts right/left;Staggering left;Staggering right     General Gait Details: close guard for safety. Walked first stretch with pt holding on to IV pole with one hand. When returning to room, pt walked without external support.   Stairs            Wheelchair Mobility    Modified Rankin (Stroke Patients Only)       Balance Overall balance assessment: Needs assistance           Standing balance-Leahy Scale: Good                               Pertinent Vitals/Pain Pain Assessment: No/denies pain    Home Living Family/patient expects to be discharged to:: Private residence Living Arrangements: Spouse/significant other Available Help at Discharge: Family Type of Home: House Home Access: Stairs to enter   Engineer, site of Steps: 2 Home Layout: One level Home Equipment: Cane - single point      Prior Function Level of Independence: Independent with assistive device(s)         Comments: uses cane PRN (mostly outside of home)     Hand Dominance        Extremity/Trunk Assessment   Upper Extremity Assessment Upper Extremity Assessment: Overall WFL for tasks assessed    Lower Extremity Assessment Lower Extremity Assessment: Generalized weakness (noted decreased DF and external rotation)    Cervical / Trunk Assessment Cervical / Trunk Assessment: Normal  Communication   Communication: No difficulties  Cognition Arousal/Alertness: Awake/alert Behavior During Therapy: WFL for tasks assessed/performed Overall Cognitive Status: No family/caregiver present to determine baseline cognitive functioning                                        General Comments      Exercises     Assessment/Plan    PT Assessment Patient needs continued PT services  PT Problem List Decreased strength;Decreased mobility       PT Treatment Interventions Gait training;Therapeutic activities;Therapeutic exercise;Patient/family education;Functional mobility training    PT Goals (Current goals can be found in the Care Plan section)  Acute Rehab PT Goals Patient Stated Goal: home soon  PT Goal Formulation: With patient Time For Goal Achievement: 11/25/16 Potential to Achieve Goals: Good    Frequency Min 3X/week   Barriers to discharge        Co-evaluation               End of Session Equipment Utilized During Treatment: Gait belt Activity Tolerance: Patient tolerated treatment well Patient left: in chair;with call bell/phone within reach;with chair alarm set   PT Visit Diagnosis: Muscle weakness (generalized) (M62.81);Difficulty in walking, not elsewhere classified (R26.2)    Time: 0211-1735 PT Time Calculation (min) (ACUTE ONLY): 17 min   Charges:   PT  Evaluation $PT Eval Low Complexity: 1 Procedure     PT G Codes:   PT G-Codes **NOT FOR INPATIENT CLASS** Functional Assessment Tool Used: AM-PAC 6 Clicks Basic Mobility;Clinical judgement Functional Limitation: Mobility: Walking and moving around Mobility: Walking and Moving Around Current Status (A7014): At least 1 percent but less than 20 percent impaired, limited or restricted Mobility: Walking and Moving Around Goal Status [phone]): At least 1 percent but less than 20 percent impaired, limited or restricted Mobility: Walking and Moving Around Discharge Status [phone]): At least 1 percent but less than 20 percent impaired, limited or restricted      Weston Anna, MPT Pager: [phone]

## 2016-11-10 NOTE — Progress Notes (Signed)
Patient's IV fluids expired. On call notified and new orders were given to restart the fluids at 100cc/hr.

## 2016-11-10 NOTE — Consult Note (Signed)
Consult Note   Referring Provider: [facility] Primary Care Physician:  Kristine Garbe, MD Primary Gastroenterologist:  Former Dr. Sharlett Iles patient  Reason for Consultation:  Abnormal esophagus on CT   Impression/ Recommendations:  1. 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 Weight loss, anorexia. Hypercalcemia being evaluated. EGD tomorrow for further evaluation. Soft diet recommended.   2. Personal history of adenomatous colon polyps. Surveillance colonoscopies deferred given current illness.   [name]. Fuller Plan MD 11/10/2016, 1:17 PM [phone] Mon-Fri 8a-5p  [phone] after 5p, weekends, holidays    HPI: Derek Stevens is a 76 y.o. male hospitalized with anorexia and weakness. History of non Hodgkin's lymphoma in 1994. Colonoscopy in 08/2012 showed 2 small tubular adenomas and diverticulosis. Hypercalcemia found. CT findings concerning for metastatic disease and an possible esophageal lesion. Pt denies dysphagia, N/V.    Past Medical History:  Diagnosis Date  . Asthma   . Cancer ([place])    lymphoma-1994  . GERD (gastroesophageal reflux disease)   . Hypertension   . Murmur, heart   . Non-Hodgkin lymphoma ([place])   . Stroke [facility]) 12/17/2010    Past Surgical History:  Procedure Laterality Date  . ANGIOPLASTY      Prior to Admission medications   Medication Sig Start Date End Date Taking? Authorizing Provider  carvedilol (COREG) 12.5 MG tablet Take 12.5 mg by mouth 2 (two) times daily with a meal.   Yes Historical Provider, MD  lisinopril (PRINIVIL,ZESTRIL) 20 MG tablet Take 20 mg by mouth daily.   Yes Historical Provider, MD  simvastatin (ZOCOR) 20 MG tablet Take 20 mg by mouth every evening.  03/10/16  Yes Historical Provider, MD    Current Facility-Administered Medications  Medication Dose Route Frequency Provider Last Rate Last Dose  . 0.9 %  sodium chloride infusion   Intravenous  Continuous Tawni Millers, MD 75 mL/hr at 11/10/16 1027    . acetaminophen (TYLENOL) tablet 650 mg  650 mg Oral Q6H PRN Charlynne Cousins, MD   650 mg at 11/09/16 1630   Or  . acetaminophen (TYLENOL) suppository 650 mg  650 mg Rectal Q6H PRN Charlynne Cousins, MD      . calcitonin (MIACALCIN) injection 100 Units  100 Units Subcutaneous Q24H Charlynne Cousins, MD   100 Units at 11/09/16 1629  . carvedilol (COREG) tablet 12.5 mg  12.5 mg Oral BID WC Charlynne Cousins, MD   12.5 mg at 11/10/16 0947  . feeding supplement (BOOST / RESOURCE BREEZE) liquid 1 Container  1 Container Oral Q24H Mauricio Daniel Arrien, MD      . feeding supplement (ENSURE ENLIVE) (ENSURE ENLIVE) liquid 237 mL  237 mL Oral BID BM Tawni Millers, MD   237 mL at 11/10/16 0947  . heparin injection 5,000 Units  5,000 Units Subcutaneous Q8H Charlynne Cousins, MD   5,000 Units at 11/10/16 [phone]  . iopamidol (ISOVUE-300) 61 % injection 30 mL  30 mL Oral Once PRN Tawni Millers, MD      . multivitamin with minerals tablet 1 tablet  1 tablet Oral Daily Mauricio Gerome Apley, MD   1 tablet at 11/10/16 [phone]  . ondansetron (ZOFRAN) tablet 4 mg  4 mg Oral Q6H PRN Charlynne Cousins, MD       Or  . ondansetron [facility]) injection 4 mg  4 mg Intravenous Q6H PRN Charlynne Cousins, MD      .  polyethylene glycol (MIRALAX / GLYCOLAX) packet 17 g  17 g Oral Daily PRN Charlynne Cousins, MD      . simvastatin (ZOCOR) tablet 20 mg  20 mg Oral QPM Charlynne Cousins, MD   20 mg at 11/09/16 1630    Allergies as of 11/08/2016  . (No Known Allergies)    Family History  Problem Relation Age of Onset  . Lung cancer Father   . Lung cancer Brother   . Colon cancer Neg Hx   . Esophageal cancer Neg Hx   . Rectal cancer Neg Hx   . Stomach cancer Neg Hx     Social History   Social History  . Marital status: Married    Spouse name: N/A  . Number of children: N/A  . Years of education: N/A   Occupational  History  . Not on file.   Social History Main Topics  . Smoking status: Former Smoker    Quit date: 08/15/1971  . Smokeless tobacco: Current User  . Alcohol use No  . Drug use: No  . Sexual activity: No   Other Topics Concern  . Not on file   Social History Narrative  . No narrative on file    Review of Systems: Gen: Denies any fever, chills, sweats, anorexia, fatigue, weakness, malaise, weight loss, and sleep disorder CV: Denies chest pain, angina, palpitations, syncope, orthopnea, PND, peripheral edema, and claudication. Resp: Denies dyspnea at rest, dyspnea with exercise, cough, sputum, wheezing, coughing up blood, and pleurisy. GI: Denies vomiting blood, jaundice, and fecal incontinence.   Denies dysphagia or odynophagia. GU : Denies urinary burning, blood in urine, urinary frequency, urinary hesitancy, nocturnal urination, and urinary incontinence. MS: Denies joint pain, limitation of movement, and swelling, stiffness, low back pain, extremity pain. Denies muscle weakness, cramps, atrophy.  Derm: Denies rash, itching, dry skin, hives, moles, warts, or unhealing ulcers.  Psych: Denies depression, anxiety, memory loss, suicidal ideation, hallucinations, paranoia, and confusion. Heme: Denies bruising, bleeding, and enlarged lymph nodes. Neuro:  Denies any headaches, dizziness, paresthesias. Endo:  Denies any problems with DM, thyroid, adrenal function.  Physical Exam: Vital signs in last 24 hours: Temp:  [98 F (36.7 C)-98.9 F (37.2 C)] 98.4 F (36.9 C) (03/30 0947) Pulse Rate:  [56-86] 65 (03/30 0947) Resp:  [16-20] 18 (03/30 0947) BP: (130-163)/(76-99) 130/77 (03/30 0947) SpO2:  [97 %-100 %] 100 % (03/30 0947) Last BM Date: 11/08/16  General:  Alert, well-developed, well-nourished, thin, in NAD Head:  Normocephalic and atraumatic. Eyes:  Sclera clear, no icterus. Conjunctiva pink. Ears:  Normal auditory acuity. Nose:  No deformity, discharge, or lesions. Mouth:  No  deformity or lesions. Oropharynx pink & moist. Neck:  Supple; no masses or thyromegaly. Chest:  Clear throughout to auscultation. No wheezes, crackles, or rhonchi. No acute distress. Heart:  Regular rate and rhythm; no murmurs, clicks, rubs, or gallops. Abdomen:  Soft, nontender and nondistended. No masses, hepatosplenomegaly or hernias noted. Normal bowel sounds, without guarding, and without rebound.   Rectal:  Deferred Msk:  Symmetrical without gross deformities. Normal posture. Pulses:  Normal pulses noted. Extremities:  Without clubbing or edema. Neurologic:  Alert and  oriented x4;  grossly normal neurologically. Skin:  Intact without significant lesions or rashes. Cervical Nodes:  No significant cervical adenopathy. Psych:  Alert and cooperative. Normal mood and affect.  Intake/Output from previous day: 03/29 0701 - 03/30 0700 In: 75 [P.O.:460; I.V.:150] Out: -  Intake/Output this shift: Total I/O In: 845 [I.V.:845] Out:  300 [Urine:300]  Lab Results:  Recent Labs  11/08/16 1020 11/08/16 1822 11/10/16 0832  WBC 8.7 9.4 6.9  HGB 12.9* 13.3 11.2*  HCT 38.5* 39.1 33.2*  PLT 361 236 301   BMET  Recent Labs  11/08/16 1020 11/08/16 1630 11/09/16 0453 11/10/16 0832  NA 133*  --  138 136  K 4.0  --  3.8 3.2*  CL 94*  --  101 101  CO2 26  --  27 27  GLUCOSE 120*  --  116* 113*  BUN 17  --  13 8  CREATININE 1.28* 1.22 0.97 0.83  CALCIUM 13.4*  --  11.7* 10.1   LFT  Recent Labs  11/08/16 1020  PROT 8.5*  ALBUMIN 4.5  AST 50*  ALT 17  ALKPHOS 69  BILITOT 0.8   Studies/Results: Ct Chest W Contrast  Result Date: 11/09/2016 CLINICAL DATA:  76 year old male with history of anorexia and generalized weakness for the past 2 weeks. Diarrhea for 24 hours. Remote history of non-Hodgkin's lymphoma treated in 1994. EXAM: CT CHEST, ABDOMEN, AND PELVIS WITH CONTRAST TECHNIQUE: Multidetector CT imaging of the chest, abdomen and pelvis was performed following the  standard protocol during bolus administration of intravenous contrast. CONTRAST:  26mL ISOVUE-300 IOPAMIDOL (ISOVUE-300) INJECTION 61% COMPARISON:  No priors. FINDINGS: CT CHEST FINDINGS Cardiovascular: Heart size is normal. There is no significant pericardial fluid, thickening or pericardial calcification. There is aortic atherosclerosis, as well as atherosclerosis of the great vessels of the mediastinum and the coronary arteries, including calcified atherosclerotic plaque in the left anterior descending coronary artery. Mediastinum/Nodes: Right infrahilar lymphadenopathy measuring up to 16 mm in short axis. Conglomerate nodal mass in the right hilar region measuring 2.3 x 3.4 cm (axial image 29 of series 2). Multiple other enlarged mediastinal lymph nodes, most notable for a paraesophageal lymph node in the inferior aspect of the middle mediastinum measuring 2.2 x 3.8 cm. Mass-like thickening of the esophageal wall at the junction of proximal to middle third of the esophagus, best appreciated on axial image 18 of series 2 and coronal image 60 of series 7. No axillary lymphadenopathy. Lungs/Pleura: There are innumerable pulmonary nodules scattered throughout the lungs bilaterally, with mid to lower lung predominance, highly concerning for widespread metastatic disease. These nodules measure up to 12 mm in diameter in the lower lobes of the lungs bilaterally (axial image 123 of series 6 in the right lower lobe and axial image 120 of series 6 in the left lower lobe). No acute consolidative airspace disease. No pleural effusions. Musculoskeletal: There are no aggressive appearing lytic or blastic lesions noted in the visualized portions of the skeleton. CT ABDOMEN PELVIS FINDINGS Hepatobiliary: There are several hypovascular masses in the liver, largest of which are centered in segment 1 (axial image 54 of series 2) and in segment 2 (axial image 54 of series 2), measuring 4.2 x 5.2 cm and 3.9 x 5.4 cm respectively. No  intra or extrahepatic biliary ductal dilatation. Gallbladder is normal in appearance. Pancreas: No pancreatic mass. No pancreatic ductal dilatation. No pancreatic or peripancreatic fluid or inflammatory changes. Spleen: Unremarkable. Adrenals/Urinary Tract: Bilateral adrenal glands and bilateral kidneys are normal in appearance. No hydroureteronephrosis. Urinary bladder is normal in appearance. Stomach/Bowel: The appearance of the stomach is normal. No pathologic dilatation of small bowel or colon. The appendix is not confidently identified and may be surgically absent. Regardless, there are no inflammatory changes noted adjacent to the cecum to suggest the presence of an acute appendicitis at this  time. Vascular/Lymphatic: Aortic atherosclerosis, without evidence of aneurysm or dissection in the abdominal or pelvic vasculature. No lymphadenopathy noted in the abdomen or pelvis. Reproductive: Prostate gland and seminal vesicles are unremarkable in appearance. Other: No significant volume of ascites.  No pneumoperitoneum. Musculoskeletal: There are no aggressive appearing lytic or blastic lesions noted in the visualized portions of the skeleton. IMPRESSION: 1. 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 The exact source of primary lung malignancy is uncertain. 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 Differential considerations would also include a primary small cell carcinoma of the lung. Further evaluation with endoscopy and PET-CT is recommended for diagnostic and staging purposes. 2. Aortic atherosclerosis, in addition to left anterior descending coronary artery disease. Assessment for potential risk factor modification, dietary therapy or pharmacologic therapy may be  warranted, if clinically indicated. 3. Additional incidental findings, as above. Electronically Signed   By: Vinnie Langton M.D.   On: 11/09/2016 11:15   Ct Abdomen Pelvis W Contrast  Result Date: 11/09/2016 CLINICAL DATA:  76 year old male with history of anorexia and generalized weakness for the past 2 weeks. Diarrhea for 24 hours. Remote history of non-Hodgkin's lymphoma treated in 1994. EXAM: CT CHEST, ABDOMEN, AND PELVIS WITH CONTRAST TECHNIQUE: Multidetector CT imaging of the chest, abdomen and pelvis was performed following the standard protocol during bolus administration of intravenous contrast. CONTRAST:  27mL ISOVUE-300 IOPAMIDOL (ISOVUE-300) INJECTION 61% COMPARISON:  No priors. FINDINGS: CT CHEST FINDINGS Cardiovascular: Heart size is normal. There is no significant pericardial fluid, thickening or pericardial calcification. There is aortic atherosclerosis, as well as atherosclerosis of the great vessels of the mediastinum and the coronary arteries, including calcified atherosclerotic plaque in the left anterior descending coronary artery. Mediastinum/Nodes: Right infrahilar lymphadenopathy measuring up to 16 mm in short axis. Conglomerate nodal mass in the right hilar region measuring 2.3 x 3.4 cm (axial image 29 of series 2). Multiple other enlarged mediastinal lymph nodes, most notable for a paraesophageal lymph node in the inferior aspect of the middle mediastinum measuring 2.2 x 3.8 cm. Mass-like thickening of the esophageal wall at the junction of proximal to middle third of the esophagus, best appreciated on axial image 18 of series 2 and coronal image 60 of series 7. No axillary lymphadenopathy. Lungs/Pleura: There are innumerable pulmonary nodules scattered throughout the lungs bilaterally, with mid to lower lung predominance, highly concerning for widespread metastatic disease. These nodules measure up to 12 mm in diameter in the lower lobes of the lungs bilaterally (axial image 123 of  series 6 in the right lower lobe and axial image 120 of series 6 in the left lower lobe). No acute consolidative airspace disease. No pleural effusions. Musculoskeletal: There are no aggressive appearing lytic or blastic lesions noted in the visualized portions of the skeleton. CT ABDOMEN PELVIS FINDINGS Hepatobiliary: There are several hypovascular masses in the liver, largest of which are centered in segment 1 (axial image 54 of series 2) and in segment 2 (axial image 54 of series 2), measuring 4.2 x 5.2 cm and 3.9 x 5.4 cm respectively. No intra or extrahepatic biliary ductal dilatation. Gallbladder is normal in appearance. Pancreas: No pancreatic mass. No pancreatic ductal dilatation. No pancreatic or peripancreatic fluid or inflammatory changes. Spleen: Unremarkable. Adrenals/Urinary Tract: Bilateral adrenal glands and bilateral kidneys are  normal in appearance. No hydroureteronephrosis. Urinary bladder is normal in appearance. Stomach/Bowel: The appearance of the stomach is normal. No pathologic dilatation of small bowel or colon. The appendix is not confidently identified and may be surgically absent. Regardless, there are no inflammatory changes noted adjacent to the cecum to suggest the presence of an acute appendicitis at this time. Vascular/Lymphatic: Aortic atherosclerosis, without evidence of aneurysm or dissection in the abdominal or pelvic vasculature. No lymphadenopathy noted in the abdomen or pelvis. Reproductive: Prostate gland and seminal vesicles are unremarkable in appearance. Other: No significant volume of ascites.  No pneumoperitoneum. Musculoskeletal: There are no aggressive appearing lytic or blastic lesions noted in the visualized portions of the skeleton. IMPRESSION: 1. 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 The exact source of primary lung malignancy is uncertain.  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 Differential considerations would also include a primary small cell carcinoma of the lung. Further evaluation with endoscopy and PET-CT is recommended for diagnostic and staging purposes. 2. Aortic atherosclerosis, in addition to left anterior descending coronary artery disease. Assessment for potential risk factor modification, dietary therapy or pharmacologic therapy may be warranted, if clinically indicated. 3. Additional incidental findings, as above. Electronically Signed   By: Vinnie Langton M.D.   On: 11/09/2016 11:15   Dg Abd Portable 1v  Result Date: 11/09/2016 CLINICAL DATA:  Diarrhea. EXAM: PORTABLE ABDOMEN - 1 VIEW COMPARISON:  Bowel gas pattern from lumbar spine radiographs 08/15/2015 FINDINGS: No bowel dilatation to suggest obstruction. Air throughout small bowel and transverse colon in the upper abdomen, nondistended. A stool ball distends the rectum spanning 8.3 cm. No evidence of free air. A calcification to the right of L5 may be vascular. Pelvic phleboliths are noted. No acute osseous abnormalities are seen. IMPRESSION: No evidence of bowel obstruction. Increased air within nondistended small bowel and transverse colon in the upper abdomen is nonspecific, may be normal for this patient or can be seen with represent enteritis. Stool ball distends the rectum. Electronically Signed   By: Jeb Levering M.D.   On: 11/09/2016 02:36

## 2016-11-10 NOTE — Care Management Note (Signed)
Case Management Note  Patient Details  Name: Derek Stevens MRN: [id number] Date of Birth: July 21, 1941  Subjective/Objective:Attending to talk to spouse about treatment plan, & GI plans. Will offer choice of [place] when spouse is back to hospital tomorrow.                    Action/Plan:d/c plan HHC.   Expected Discharge Date:                  Expected Discharge Plan:  [name]  In-House Referral:     Discharge planning Services  CM Consult  Post Acute Care Choice:    Choice offered to:  Spouse  DME Arranged:    DME Agency:     HH Arranged:    [place] Agency:     Status of Service:  In process, will continue to follow  If discussed at Long Length of Stay Meetings, dates discussed:    Additional Comments:  Dessa Phi, RN 11/10/2016, 1:05 PM

## 2016-11-10 NOTE — Care Management Note (Signed)
Case Management Note  Patient Details  Name: Derek Stevens MRN: [id number] Date of Birth: 01-18-1941  Subjective/Objective: PT recc supv/HHC. TC spouse-Duree c#[phone] to discuss [place] & choice-spouse just had eye surgery-will call her back in 48minutes per her request-she also wants to talk to attending about treatment plan & findings.                    Action/Plan:d/c plan home w/HHC.   Expected Discharge Date:                  Expected Discharge Plan:  [place]  In-House Referral:     Discharge planning Services  CM Consult  Post Acute Care Choice:    Choice offered to:  Spouse  DME Arranged:    DME Agency:     HH Arranged:    [place] Agency:     Status of Service:  In process, will continue to follow  If discussed at Long Length of Stay Meetings, dates discussed:    Additional Comments:  Dessa Phi, RN 11/10/2016, 12:17 PM

## 2016-11-10 NOTE — Progress Notes (Signed)
PROGRESS NOTE    Derek Stevens  TGG:[id number] DOB: 1941-05-31 DOA: 11/08/2016 PCP: Kristine Garbe, MD   Brief Narrative:  76 yo male presented with anorexia and generalized weakness, history of non Hodgkin's lymphoma in 1994. Positive diarrhea and weakness for the last 2 weeks before hospitalization. On admission hemodynamically stable, moist mucous membranes, non focal. Serum calcium elevated up to 13.4. Started on IV fluids and calcitonin, follow on ct chest, abdomen and pelvis.   Assessment & Plan:   Active Problems:   Hypercalcemia   Anorexia   Weight loss   AKI (acute kidney injury) ([place])   Essential hypertension   Protein-calorie malnutrition, severe   1. Hypercalcemia due to suspected metastatic malignancy. Serum ca continue to trend down, today at 10.1, improved symptoms will continue saline IV at 75 ml/ hr and calcitonin. Low vitamin d levels but normal pth. CT ce metastatic malignancy, possible primary esophageal, will consult GI for endoscopy.     2. AKI. Renal function with cr 0.87 with K at 3,2 and Na at 136, Cl 101. Will continue on saline IV, at 75 cc/hr. Replete K and follow on Mg.   3. HTN. Systolic blood pressure 703 systolic, continue blood pressure control with coreg.   4. Diarrhea. Improved, no abdominal pain and patient tolerating po well.    5. Malnutrition. Continue feeding supplements.    DVT prophylaxis: Heparin  Code Status: Full  Family Communication: No family at the bedside  Disposition Plan: Home .   Consultants:   Gastroenterology  Procedures:   Antimicrobials:      Subjective: Patient feeling better, appetite has improved, no nausea or vomiting, able to ambulate with physical therapy.   Objective: Vitals:   11/09/16 2012 11/09/16 2133 11/10/16 0554 11/10/16 0947  BP: (!) 163/99 (!) 163/99 (!) 163/81 130/77  Pulse: 64 61 86 65  Resp:  18 20 18   Temp: 98.2 F (36.8 C) 98.2 F (36.8 C) 98.9 F (37.2 C) 98.4 F (36.9  C)  TempSrc: Oral Oral Oral Oral  SpO2: 97% 98% 100% 100%  Weight:      Height:        Intake/Output Summary (Last 24 hours) at 11/10/16 1248 Last data filed at 11/10/16 1027  Gross per 24 hour  Intake             1455 ml  Output              300 ml  Net             1155 ml   Filed Weights   11/08/16 1007 11/08/16 1500  Weight: 68 kg (150 lb) 55 kg (121 lb 4.1 oz)    Examination:  General exam: deconditioned E ENT: mild pallor, oral mucosa moist, no icterus Respiratory system: Clear to auscultation. Respiratory effort normal. No wheezing, rales or rhonchi.  Cardiovascular system: S1 & S2 heard, RRR. No JVD, murmurs, rubs, gallops or clicks. No pedal edema. Gastrointestinal system: Abdomen is nondistended, soft and nontender. No organomegaly or masses felt. Normal bowel sounds heard. Central nervous system: Alert and oriented. No focal neurological deficits. Extremities: Symmetric 5 x 5 power. Skin: No rashes, lesions or ulcers     Data Reviewed: I have personally reviewed following labs and imaging studies  CBC:  Recent Labs Lab 11/08/16 1020 11/08/16 1822 11/10/16 0832  WBC 8.7 9.4 6.9  NEUTROABS  --   --  4.9  HGB 12.9* 13.3 11.2*  HCT 38.5* 39.1 33.2*  MCV  68.4* 70.6* 69.2*  PLT 361 236 947   Basic Metabolic Panel:  Recent Labs Lab 11/08/16 1019 11/08/16 1020 11/08/16 1630 11/09/16 0453 11/10/16 0832  NA  --  133*  --  138 136  K  --  4.0  --  3.8 3.2*  CL  --  94*  --  101 101  CO2  --  26  --  27 27  GLUCOSE  --  120*  --  116* 113*  BUN  --  17  --  13 8  CREATININE  --  1.28* 1.22 0.97 0.83  CALCIUM  --  13.4*  --  11.7* 10.1  MG 1.8  --   --   --   --   PHOS  --   --  2.5  --   --    GFR: Estimated Creatinine Clearance: 61.7 mL/min (by C-G formula based on SCr of 0.83 mg/dL). Liver Function Tests:  Recent Labs Lab 11/08/16 1020  AST 50*  ALT 17  ALKPHOS 69  BILITOT 0.8  PROT 8.5*  ALBUMIN 4.5    Recent Labs Lab  11/08/16 1020  LIPASE 31   No results for input(s): AMMONIA in the last 168 hours. Coagulation Profile: No results for input(s): INR, PROTIME in the last 168 hours. Cardiac Enzymes: No results for input(s): CKTOTAL, CKMB, CKMBINDEX, TROPONINI in the last 168 hours. BNP (last 3 results) No results for input(s): PROBNP in the last 8760 hours. HbA1C: No results for input(s): HGBA1C in the last 72 hours. CBG: No results for input(s): GLUCAP in the last 168 hours. Lipid Profile: No results for input(s): CHOL, HDL, LDLCALC, TRIG, CHOLHDL, LDLDIRECT in the last 72 hours. Thyroid Function Tests:  Recent Labs  11/08/16 1630  TSH 5.424*   Anemia Panel: No results for input(s): VITAMINB12, FOLATE, FERRITIN, TIBC, IRON, RETICCTPCT in the last 72 hours. Sepsis Labs: No results for input(s): PROCALCITON, LATICACIDVEN in the last 168 hours.  No results found for this or any previous visit (from the past 240 hour(s)).       Radiology Studies: Ct Chest W Contrast  Result Date: 11/09/2016 CLINICAL DATA:  76 year old male with history of anorexia and generalized weakness for the past 2 weeks. Diarrhea for 24 hours. Remote history of non-Hodgkin's lymphoma treated in 1994. EXAM: CT CHEST, ABDOMEN, AND PELVIS WITH CONTRAST TECHNIQUE: Multidetector CT imaging of the chest, abdomen and pelvis was performed following the standard protocol during bolus administration of intravenous contrast. CONTRAST:  77mL ISOVUE-300 IOPAMIDOL (ISOVUE-300) INJECTION 61% COMPARISON:  No priors. FINDINGS: CT CHEST FINDINGS Cardiovascular: Heart size is normal. There is no significant pericardial fluid, thickening or pericardial calcification. There is aortic atherosclerosis, as well as atherosclerosis of the great vessels of the mediastinum and the coronary arteries, including calcified atherosclerotic plaque in the left anterior descending coronary artery. Mediastinum/Nodes: Right infrahilar lymphadenopathy measuring up  to 16 mm in short axis. Conglomerate nodal mass in the right hilar region measuring 2.3 x 3.4 cm (axial image 29 of series 2). Multiple other enlarged mediastinal lymph nodes, most notable for a paraesophageal lymph node in the inferior aspect of the middle mediastinum measuring 2.2 x 3.8 cm. Mass-like thickening of the esophageal wall at the junction of proximal to middle third of the esophagus, best appreciated on axial image 18 of series 2 and coronal image 60 of series 7. No axillary lymphadenopathy. Lungs/Pleura: There are innumerable pulmonary nodules scattered throughout the lungs bilaterally, with mid to lower lung predominance, highly concerning  for widespread metastatic disease. These nodules measure up to 12 mm in diameter in the lower lobes of the lungs bilaterally (axial image 123 of series 6 in the right lower lobe and axial image 120 of series 6 in the left lower lobe). No acute consolidative airspace disease. No pleural effusions. Musculoskeletal: There are no aggressive appearing lytic or blastic lesions noted in the visualized portions of the skeleton. CT ABDOMEN PELVIS FINDINGS Hepatobiliary: There are several hypovascular masses in the liver, largest of which are centered in segment 1 (axial image 54 of series 2) and in segment 2 (axial image 54 of series 2), measuring 4.2 x 5.2 cm and 3.9 x 5.4 cm respectively. No intra or extrahepatic biliary ductal dilatation. Gallbladder is normal in appearance. Pancreas: No pancreatic mass. No pancreatic ductal dilatation. No pancreatic or peripancreatic fluid or inflammatory changes. Spleen: Unremarkable. Adrenals/Urinary Tract: Bilateral adrenal glands and bilateral kidneys are normal in appearance. No hydroureteronephrosis. Urinary bladder is normal in appearance. Stomach/Bowel: The appearance of the stomach is normal. No pathologic dilatation of small bowel or colon. The appendix is not confidently identified and may be surgically absent. Regardless,  there are no inflammatory changes noted adjacent to the cecum to suggest the presence of an acute appendicitis at this time. Vascular/Lymphatic: Aortic atherosclerosis, without evidence of aneurysm or dissection in the abdominal or pelvic vasculature. No lymphadenopathy noted in the abdomen or pelvis. Reproductive: Prostate gland and seminal vesicles are unremarkable in appearance. Other: No significant volume of ascites.  No pneumoperitoneum. Musculoskeletal: There are no aggressive appearing lytic or blastic lesions noted in the visualized portions of the skeleton. IMPRESSION: 1. 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 The exact source of primary lung malignancy is uncertain. 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 Differential considerations would also include a primary small cell carcinoma of the lung. Further evaluation with endoscopy and PET-CT is recommended for diagnostic and staging purposes. 2. Aortic atherosclerosis, in addition to left anterior descending coronary artery disease. Assessment for potential risk factor modification, dietary therapy or pharmacologic therapy may be warranted, if clinically indicated. 3. Additional incidental findings, as above. Electronically Signed   By: Vinnie Langton M.D.   On: 11/09/2016 11:15   Ct Abdomen Pelvis W Contrast  Result Date: 11/09/2016 CLINICAL DATA:  76 year old male with history of anorexia and generalized weakness for the past 2 weeks. Diarrhea for 24 hours. Remote history of non-Hodgkin's lymphoma treated in 1994. EXAM: CT CHEST, ABDOMEN, AND PELVIS WITH CONTRAST TECHNIQUE: Multidetector CT imaging of the chest, abdomen and pelvis was performed following the standard protocol during bolus administration of  intravenous contrast. CONTRAST:  67mL ISOVUE-300 IOPAMIDOL (ISOVUE-300) INJECTION 61% COMPARISON:  No priors. FINDINGS: CT CHEST FINDINGS Cardiovascular: Heart size is normal. There is no significant pericardial fluid, thickening or pericardial calcification. There is aortic atherosclerosis, as well as atherosclerosis of the great vessels of the mediastinum and the coronary arteries, including calcified atherosclerotic plaque in the left anterior descending coronary artery. Mediastinum/Nodes: Right infrahilar lymphadenopathy measuring up to 16 mm in short axis. Conglomerate nodal mass in the right hilar region measuring 2.3 x 3.4 cm (axial image 29 of series 2). Multiple other enlarged mediastinal lymph nodes, most notable for a paraesophageal lymph node in the inferior aspect of the middle mediastinum measuring  2.2 x 3.8 cm. Mass-like thickening of the esophageal wall at the junction of proximal to middle third of the esophagus, best appreciated on axial image 18 of series 2 and coronal image 60 of series 7. No axillary lymphadenopathy. Lungs/Pleura: There are innumerable pulmonary nodules scattered throughout the lungs bilaterally, with mid to lower lung predominance, highly concerning for widespread metastatic disease. These nodules measure up to 12 mm in diameter in the lower lobes of the lungs bilaterally (axial image 123 of series 6 in the right lower lobe and axial image 120 of series 6 in the left lower lobe). No acute consolidative airspace disease. No pleural effusions. Musculoskeletal: There are no aggressive appearing lytic or blastic lesions noted in the visualized portions of the skeleton. CT ABDOMEN PELVIS FINDINGS Hepatobiliary: There are several hypovascular masses in the liver, largest of which are centered in segment 1 (axial image 54 of series 2) and in segment 2 (axial image 54 of series 2), measuring 4.2 x 5.2 cm and 3.9 x 5.4 cm respectively. No intra or extrahepatic biliary ductal dilatation.  Gallbladder is normal in appearance. Pancreas: No pancreatic mass. No pancreatic ductal dilatation. No pancreatic or peripancreatic fluid or inflammatory changes. Spleen: Unremarkable. Adrenals/Urinary Tract: Bilateral adrenal glands and bilateral kidneys are normal in appearance. No hydroureteronephrosis. Urinary bladder is normal in appearance. Stomach/Bowel: The appearance of the stomach is normal. No pathologic dilatation of small bowel or colon. The appendix is not confidently identified and may be surgically absent. Regardless, there are no inflammatory changes noted adjacent to the cecum to suggest the presence of an acute appendicitis at this time. Vascular/Lymphatic: Aortic atherosclerosis, without evidence of aneurysm or dissection in the abdominal or pelvic vasculature. No lymphadenopathy noted in the abdomen or pelvis. Reproductive: Prostate gland and seminal vesicles are unremarkable in appearance. Other: No significant volume of ascites.  No pneumoperitoneum. Musculoskeletal: There are no aggressive appearing lytic or blastic lesions noted in the visualized portions of the skeleton. IMPRESSION: 1. 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 The exact source of primary lung malignancy is uncertain. 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 Differential considerations would also include a primary small cell carcinoma of the lung. Further evaluation with endoscopy and PET-CT is recommended for diagnostic and staging purposes. 2. Aortic atherosclerosis, in addition to left anterior descending coronary artery disease. Assessment for potential risk factor modification, dietary therapy or pharmacologic therapy may be warranted, if clinically indicated. 3. Additional  incidental findings, as above. Electronically Signed   By: Vinnie Langton M.D.   On: 11/09/2016 11:15   Dg Abd Portable 1v  Result Date: 11/09/2016 CLINICAL DATA:  Diarrhea. EXAM: PORTABLE ABDOMEN - 1 VIEW COMPARISON:  Bowel gas pattern from lumbar spine radiographs 08/15/2015 FINDINGS: No bowel dilatation to suggest obstruction. Air throughout small bowel and transverse colon in the upper abdomen, nondistended. A stool ball distends the rectum spanning 8.3 cm. No evidence of free air. A calcification to the right of L5 may be vascular. Pelvic phleboliths are noted. No acute osseous abnormalities are seen. IMPRESSION: No evidence of bowel obstruction. Increased air within nondistended small bowel and transverse colon in the upper abdomen is nonspecific, may be normal for this patient or can be seen with represent enteritis. Stool ball distends the rectum. Electronically Signed  By: Jeb Levering M.D.   On: 11/09/2016 02:36        Scheduled Meds: . calcitonin  100 Units Subcutaneous Q24H  . carvedilol  12.5 mg Oral BID WC  . feeding supplement  1 Container Oral Q24H  . feeding supplement (ENSURE ENLIVE)  237 mL Oral BID BM  . heparin  5,000 Units Subcutaneous Q8H  . multivitamin with minerals  1 tablet Oral Daily  . simvastatin  20 mg Oral QPM   Continuous Infusions: . sodium chloride 75 mL/hr at 11/10/16 1027     LOS: 0 days      Tawni Millers, MD [facility] Pager [phone]  If 7PM-7AM, please contact night-coverage [email or web address] Password [facility] 11/10/2016, 12:48 PM

## 2016-11-10 NOTE — Progress Notes (Signed)
NUTRITION NOTE  Pt seen by this RD yesterday. New consult received for assessment. Pt currently on Regular diet with no PO intakes documented since admission. Pt states that he continues with very poor appetite and no desire to eat. He has breakfast tray in front of him (orange juice, coffee, scrambled eggs, biscuit, oatmeal, and low sodium pork sausage patty). Pt reports that his wife is supposed to visit him and that he does not want to attempt to eat until she arrives. Pt seemed to have some slight confusion at times during discussion. He reports that he was able to eat a few bites of dinner last night. Unable to confirm or deny if he eats better when family is present. Yesterday and today pt talked extensively about his family and the joy they bring him but mentions that no one has visited him; seems to have some depression surrounding this based on tone of voice and body language.   Yesterday RD ordered Boost Breeze once/day and Ensure Enlive BID to supplement. Pt accepted one bottle of Ensure yesterday and refused the other Ensure and refused Boost Breeze. Will continue orders at this time and continue to monitor for needs or need to adjust oral nutrition supplements.   Pt meets criteria for severe malnutrition in the context of acute illness based on severe muscle and severe fat wasting. Pt has experienced 19.3% body weight loss in the past 6 months.  RD will continue to follow per protocol.    Derek Matin, MS, RD, LDN, [facility] Clinical Dietitian Pager # [phone] After hours/weekend pager # [phone]

## 2016-11-11 ENCOUNTER — Encounter (HOSPITAL_COMMUNITY)
Admission: EM | Disposition: A | Discharge status: Home Health | Payer: Self-pay | Source: Home / Self Care | Attending: Internal Medicine

## 2016-11-11 ENCOUNTER — Encounter (HOSPITAL_COMMUNITY): Payer: Self-pay

## 2016-11-11 DIAGNOSIS — K229 Disease of esophagus, unspecified: Secondary | ICD-10-CM

## 2016-11-11 DIAGNOSIS — C159 Malignant neoplasm of esophagus, unspecified: Secondary | ICD-10-CM

## 2016-11-11 DIAGNOSIS — R197 Diarrhea, unspecified: Secondary | ICD-10-CM

## 2016-11-11 HISTORY — PX: ESOPHAGOGASTRODUODENOSCOPY: SHX5428

## 2016-11-11 LAB — BASIC METABOLIC PANEL
ANION GAP: 5 (ref 5–15)
BUN: 7 mg/dL (ref 6–20)
CO2: 25 mmol/L (ref 22–32)
CREATININE: 0.9 mg/dL (ref 0.61–1.24)
Calcium: 9.9 mg/dL (ref 8.9–10.3)
Chloride: 105 mmol/L (ref 101–111)
GLUCOSE: 95 mg/dL (ref 65–99)
POTASSIUM: 3.8 mmol/L (ref 3.5–5.1)
Sodium: 135 mmol/L (ref 135–145)

## 2016-11-11 LAB — VITAMIN D 25 HYDROXY (VIT D DEFICIENCY, FRACTURES): VIT D 25 HYDROXY: 16.3 ng/mL — AB (ref 30.0–100.0)

## 2016-11-11 LAB — PTH, INTACT AND CALCIUM
CALCIUM TOTAL (PTH): 10.1 mg/dL (ref 8.6–10.2)
PTH: 34 pg/mL (ref 15–65)

## 2016-11-11 LAB — MAGNESIUM: MAGNESIUM: 1.3 mg/dL — AB (ref 1.7–2.4)

## 2016-11-11 SURGERY — EGD (ESOPHAGOGASTRODUODENOSCOPY)
Anesthesia: Moderate Sedation

## 2016-11-11 MED ORDER — MIDAZOLAM HCL 10 MG/2ML IJ SOLN
INTRAMUSCULAR | Status: DC | PRN
Start: 2016-11-11 — End: 2016-11-11
  Administered 2016-11-11 (×2): 1 mg via INTRAVENOUS
  Administered 2016-11-11: 2 mg via INTRAVENOUS

## 2016-11-11 MED ORDER — MIDAZOLAM HCL 5 MG/ML IJ SOLN
INTRAMUSCULAR | Status: AC
  Filled 2016-11-11: qty 2

## 2016-11-11 MED ORDER — FENTANYL CITRATE (PF) 100 MCG/2ML IJ SOLN
INTRAMUSCULAR | Status: AC
  Filled 2016-11-11: qty 2

## 2016-11-11 MED ORDER — PANTOPRAZOLE SODIUM 40 MG PO TBEC
40.0000 mg | DELAYED_RELEASE_TABLET | Freq: Every day | ORAL | Status: DC
  Administered 2016-11-11 – 2016-11-12 (×2): 40 mg via ORAL
  Filled 2016-11-11 (×2): qty 1

## 2016-11-11 MED ORDER — FENTANYL CITRATE (PF) 100 MCG/2ML IJ SOLN
INTRAMUSCULAR | Status: DC | PRN
  Administered 2016-11-11 (×2): 25 ug via INTRAVENOUS

## 2016-11-11 MED ORDER — BUTAMBEN-TETRACAINE-BENZOCAINE 2-2-14 % EX AERO
INHALATION_SPRAY | CUTANEOUS | Status: DC | PRN
  Administered 2016-11-11: 2 via TOPICAL

## 2016-11-11 MED ORDER — MAGNESIUM SULFATE 2 GM/50ML IV SOLN
2.0000 g | Freq: Once | INTRAVENOUS | Status: AC
  Administered 2016-11-11: 2 g via INTRAVENOUS
  Filled 2016-11-11: qty 50

## 2016-11-11 NOTE — Progress Notes (Signed)
PROGRESS NOTE    VENUS RUHE  UXL:[id number] DOB: September 03, 1940 DOA: 11/08/2016 PCP: Kristine Garbe, MD    Brief Narrative:   76 yo male presented with anorexia and generalized weakness, history of non Hodgkin's lymphoma in 1994. Positive diarrhea and weakness for the last 2 weeks before hospitalization. On admission hemodynamically stable, moist mucous membranes, non focal. Serum calcium elevated up to 13.4. Started on IV fluids and calcitonin, ct chest, abdomen and pelvis with signs of metastatic disease possible primary esophageal. EGD noted intraluminal malignant looking lesion, biopsy was taken.   Assessment & Plan:   Active Problems:   Hypercalcemia   Anorexia   Weight loss   AKI (acute kidney injury) ([place])   Essential hypertension   Protein-calorie malnutrition, severe   1. Hypercalcemia due to suspected metastatic malignancy. Serum ca continue to trend down, today down to 9.9. Will hold on IV fluids for now. Low vitamin d levels but normal pth, will hold on vitamin d repletion for now. Patient had upper endoscopy with findings consistent with possible esophageal malignancy. Follow on pathology.   2. AKI. Renal function with cr 0.90 with K at 3,8. Patient tolerating po, will hold on IV fluids and will replete mag with mag sulfate. Noted phos at 2,5.   3. HTN. Continue blood pressure control with coreg.   4. Diarrhea. Tolerating po well, not reported diarrhea.   5. Malnutrition. Continue feeding supplements, per nutrition recommendations.    DVT prophylaxis: Heparin  Code Status: Full  Family Communication: No family at the bedside  Disposition Plan: Home .    Consultants:   Gastroenterology   Procedures:     Antimicrobials:       Subjective: Patient sp endoscopy, no nausea or vomiting, mild abdominal discomfort. No chest pain, weakness have been improving.   Objective: Vitals:   11/11/16 1055 11/11/16 1100 11/11/16 1110 11/11/16 1128  BP:  (!) 158/81 (!) 149/73 (!) 141/86 (!) 146/84  Pulse: (!) 57 (!) 58 (!) 58 (!) 58  Resp: 16 15 14 20   Temp:      TempSrc:      SpO2: 99% 99% 95% 97%  Weight:      Height:        Intake/Output Summary (Last 24 hours) at 11/11/16 1234 Last data filed at 11/11/16 0945  Gross per 24 hour  Intake          1716.25 ml  Output              250 ml  Net          1466.25 ml   Filed Weights   11/08/16 1007 11/08/16 1500  Weight: 68 kg (150 lb) 55 kg (121 lb 4.1 oz)    Examination:  General exam: deconditioned E ENT: no pallor or icterus Respiratory system: Clear to auscultation. Respiratory effort normal. Cardiovascular system: S1 & S2 heard, RRR. No JVD, murmurs, rubs, gallops or clicks. No pedal edema. Gastrointestinal system: Abdomen is mild distended, soft and nontender. No organomegaly or masses felt. Normal bowel sounds heard. Central nervous system: Alert and oriented. No focal neurological deficits. Extremities: Symmetric 5 x 5 power. Skin: No rashes, lesions or ulcers     Data Reviewed: I have personally reviewed following labs and imaging studies  CBC:  Recent Labs Lab 11/08/16 1020 11/08/16 1822 11/10/16 0832  WBC 8.7 9.4 6.9  NEUTROABS  --   --  4.9  HGB 12.9* 13.3 11.2*  HCT 38.5* 39.1 33.2*  MCV 68.4*  70.6* 69.2*  PLT 361 236 197   Basic Metabolic Panel:  Recent Labs Lab 11/08/16 1019 11/08/16 1020 11/08/16 1630 11/09/16 0453 11/10/16 0832 11/11/16 0526  NA  --  133*  --  138 136 135  K  --  4.0  --  3.8 3.2* 3.8  CL  --  94*  --  101 101 105  CO2  --  26  --  27 27 25   GLUCOSE  --  120*  --  116* 113* 95  BUN  --  17  --  13 8 7   CREATININE  --  1.28* 1.22 0.97 0.83 0.90  CALCIUM  --  13.4*  --  11.7* 10.1  10.1 9.9  MG 1.8  --   --   --   --  1.3*  PHOS  --   --  2.5  --   --   --    GFR: Estimated Creatinine Clearance: 56.9 mL/min (by C-G formula based on SCr of 0.9 mg/dL). Liver Function Tests:  Recent Labs Lab 11/08/16 1020  AST  50*  ALT 17  ALKPHOS 69  BILITOT 0.8  PROT 8.5*  ALBUMIN 4.5    Recent Labs Lab 11/08/16 1020  LIPASE 31   No results for input(s): AMMONIA in the last 168 hours. Coagulation Profile: No results for input(s): INR, PROTIME in the last 168 hours. Cardiac Enzymes: No results for input(s): CKTOTAL, CKMB, CKMBINDEX, TROPONINI in the last 168 hours. BNP (last 3 results) No results for input(s): PROBNP in the last 8760 hours. HbA1C: No results for input(s): HGBA1C in the last 72 hours. CBG: No results for input(s): GLUCAP in the last 168 hours. Lipid Profile: No results for input(s): CHOL, HDL, LDLCALC, TRIG, CHOLHDL, LDLDIRECT in the last 72 hours. Thyroid Function Tests:  Recent Labs  11/08/16 1630  TSH 5.424*   Anemia Panel: No results for input(s): VITAMINB12, FOLATE, FERRITIN, TIBC, IRON, RETICCTPCT in the last 72 hours. Sepsis Labs: No results for input(s): PROCALCITON, LATICACIDVEN in the last 168 hours.  No results found for this or any previous visit (from the past 240 hour(s)).       Radiology Studies: No results found.      Scheduled Meds: . carvedilol  12.5 mg Oral BID WC  . feeding supplement  1 Container Oral Q24H  . feeding supplement (ENSURE ENLIVE)  237 mL Oral BID BM  . multivitamin with minerals  1 tablet Oral Daily  . pantoprazole  40 mg Oral Q0600  . simvastatin  20 mg Oral QPM   Continuous Infusions: . sodium chloride 75 mL/hr at 11/11/16 1128     LOS: 1 day      Tawni Millers, MD [facility] Pager [phone]  If 7PM-7AM, please contact night-coverage [email or web address] Password [facility] 11/11/2016, 12:34 PM

## 2016-11-11 NOTE — H&P (View-Only) (Signed)
Consult Note   Referring Provider: [facility] Primary Care Physician:  Kristine Garbe, MD Primary Gastroenterologist:  Former Dr. Sharlett Iles patient  Reason for Consultation:  Abnormal esophagus on CT   Impression/ Recommendations:  1. 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 Weight loss, anorexia. Hypercalcemia being evaluated. EGD tomorrow for further evaluation. Soft diet recommended.   2. Personal history of adenomatous colon polyps. Surveillance colonoscopies deferred given current illness.   Derek Stevens. Fuller Plan MD 11/10/2016, 1:17 PM [phone] Mon-Fri 8a-5p  [phone] after 5p, weekends, holidays    HPI: Derek Stevens is a 76 y.o. male hospitalized with anorexia and weakness. History of non Hodgkin's lymphoma in 1994. Colonoscopy in 08/2012 showed 2 small tubular adenomas and diverticulosis. Hypercalcemia found. CT findings concerning for metastatic disease and an possible esophageal lesion. Pt denies dysphagia, N/V.    Past Medical History:  Diagnosis Date  . Asthma   . Cancer ([place])    lymphoma-1994  . GERD (gastroesophageal reflux disease)   . Hypertension   . Murmur, heart   . Non-Hodgkin lymphoma ([place])   . Stroke [facility]) 12/17/2010    Past Surgical History:  Procedure Laterality Date  . ANGIOPLASTY      Prior to Admission medications   Medication Sig Start Date End Date Taking? Authorizing Provider  carvedilol (COREG) 12.5 MG tablet Take 12.5 mg by mouth 2 (two) times daily with a meal.   Yes Historical Provider, MD  lisinopril (PRINIVIL,ZESTRIL) 20 MG tablet Take 20 mg by mouth daily.   Yes Historical Provider, MD  simvastatin (ZOCOR) 20 MG tablet Take 20 mg by mouth every evening.  03/10/16  Yes Historical Provider, MD    Current Facility-Administered Medications  Medication Dose Route Frequency Provider Last Rate Last Dose  . 0.9 %  sodium chloride infusion   Intravenous  Continuous Tawni Millers, MD 75 mL/hr at 11/10/16 1027    . acetaminophen (TYLENOL) tablet 650 mg  650 mg Oral Q6H PRN Charlynne Cousins, MD   650 mg at 11/09/16 1630   Or  . acetaminophen (TYLENOL) suppository 650 mg  650 mg Rectal Q6H PRN Charlynne Cousins, MD      . calcitonin (MIACALCIN) injection 100 Units  100 Units Subcutaneous Q24H Charlynne Cousins, MD   100 Units at 11/09/16 1629  . carvedilol (COREG) tablet 12.5 mg  12.5 mg Oral BID WC Charlynne Cousins, MD   12.5 mg at 11/10/16 0947  . feeding supplement (BOOST / RESOURCE BREEZE) liquid 1 Container  1 Container Oral Q24H Mauricio Daniel Arrien, MD      . feeding supplement (ENSURE ENLIVE) (ENSURE ENLIVE) liquid 237 mL  237 mL Oral BID BM Tawni Millers, MD   237 mL at 11/10/16 0947  . heparin injection 5,000 Units  5,000 Units Subcutaneous Q8H Charlynne Cousins, MD   5,000 Units at 11/10/16 [phone]  . iopamidol (ISOVUE-300) 61 % injection 30 mL  30 mL Oral Once PRN Tawni Millers, MD      . multivitamin with minerals tablet 1 tablet  1 tablet Oral Daily Mauricio Gerome Apley, MD   1 tablet at 11/10/16 [phone]  . ondansetron (ZOFRAN) tablet 4 mg  4 mg Oral Q6H PRN Charlynne Cousins, MD       Or  . ondansetron [facility]) injection 4 mg  4 mg Intravenous Q6H PRN Charlynne Cousins, MD      .  polyethylene glycol (MIRALAX / GLYCOLAX) packet 17 g  17 g Oral Daily PRN Charlynne Cousins, MD      . simvastatin (ZOCOR) tablet 20 mg  20 mg Oral QPM Charlynne Cousins, MD   20 mg at 11/09/16 1630    Allergies as of 11/08/2016  . (No Known Allergies)    Family History  Problem Relation Age of Onset  . Lung cancer Father   . Lung cancer Brother   . Colon cancer Neg Hx   . Esophageal cancer Neg Hx   . Rectal cancer Neg Hx   . Stomach cancer Neg Hx     Social History   Social History  . Marital status: Married    Spouse name: N/A  . Number of children: N/A  . Years of education: N/A   Occupational  History  . Not on file.   Social History Main Topics  . Smoking status: Former Smoker    Quit date: 08/15/1971  . Smokeless tobacco: Current User  . Alcohol use No  . Drug use: No  . Sexual activity: No   Other Topics Concern  . Not on file   Social History Narrative  . No narrative on file    Review of Systems: Gen: Denies any fever, chills, sweats, anorexia, fatigue, weakness, malaise, weight loss, and sleep disorder CV: Denies chest pain, angina, palpitations, syncope, orthopnea, PND, peripheral edema, and claudication. Resp: Denies dyspnea at rest, dyspnea with exercise, cough, sputum, wheezing, coughing up blood, and pleurisy. GI: Denies vomiting blood, jaundice, and fecal incontinence.   Denies dysphagia or odynophagia. GU : Denies urinary burning, blood in urine, urinary frequency, urinary hesitancy, nocturnal urination, and urinary incontinence. MS: Denies joint pain, limitation of movement, and swelling, stiffness, low back pain, extremity pain. Denies muscle weakness, cramps, atrophy.  Derm: Denies rash, itching, dry skin, hives, moles, warts, or unhealing ulcers.  Psych: Denies depression, anxiety, memory loss, suicidal ideation, hallucinations, paranoia, and confusion. Heme: Denies bruising, bleeding, and enlarged lymph nodes. Neuro:  Denies any headaches, dizziness, paresthesias. Endo:  Denies any problems with DM, thyroid, adrenal function.  Physical Exam: Vital signs in last 24 hours: Temp:  [98 F (36.7 C)-98.9 F (37.2 C)] 98.4 F (36.9 C) (03/30 0947) Pulse Rate:  [56-86] 65 (03/30 0947) Resp:  [16-20] 18 (03/30 0947) BP: (130-163)/(76-99) 130/77 (03/30 0947) SpO2:  [97 %-100 %] 100 % (03/30 0947) Last BM Date: 11/08/16  General:  Alert, well-developed, well-nourished, thin, in NAD Head:  Normocephalic and atraumatic. Eyes:  Sclera clear, no icterus. Conjunctiva pink. Ears:  Normal auditory acuity. Nose:  No deformity, discharge, or lesions. Mouth:  No  deformity or lesions. Oropharynx pink & moist. Neck:  Supple; no masses or thyromegaly. Chest:  Clear throughout to auscultation. No wheezes, crackles, or rhonchi. No acute distress. Heart:  Regular rate and rhythm; no murmurs, clicks, rubs, or gallops. Abdomen:  Soft, nontender and nondistended. No masses, hepatosplenomegaly or hernias noted. Normal bowel sounds, without guarding, and without rebound.   Rectal:  Deferred Msk:  Symmetrical without gross deformities. Normal posture. Pulses:  Normal pulses noted. Extremities:  Without clubbing or edema. Neurologic:  Alert and  oriented x4;  grossly normal neurologically. Skin:  Intact without significant lesions or rashes. Cervical Nodes:  No significant cervical adenopathy. Psych:  Alert and cooperative. Normal mood and affect.  Intake/Output from previous day: 03/29 0701 - 03/30 0700 In: 75 [P.O.:460; I.V.:150] Out: -  Intake/Output this shift: Total I/O In: 845 [I.V.:845] Out:  300 [Urine:300]  Lab Results:  Recent Labs  11/08/16 1020 11/08/16 1822 11/10/16 0832  WBC 8.7 9.4 6.9  HGB 12.9* 13.3 11.2*  HCT 38.5* 39.1 33.2*  PLT 361 236 301   BMET  Recent Labs  11/08/16 1020 11/08/16 1630 11/09/16 0453 11/10/16 0832  NA 133*  --  138 136  K 4.0  --  3.8 3.2*  CL 94*  --  101 101  CO2 26  --  27 27  GLUCOSE 120*  --  116* 113*  BUN 17  --  13 8  CREATININE 1.28* 1.22 0.97 0.83  CALCIUM 13.4*  --  11.7* 10.1   LFT  Recent Labs  11/08/16 1020  PROT 8.5*  ALBUMIN 4.5  AST 50*  ALT 17  ALKPHOS 69  BILITOT 0.8   Studies/Results: Ct Chest W Contrast  Result Date: 11/09/2016 CLINICAL DATA:  76 year old male with history of anorexia and generalized weakness for the past 2 weeks. Diarrhea for 24 hours. Remote history of non-Hodgkin's lymphoma treated in 1994. EXAM: CT CHEST, ABDOMEN, AND PELVIS WITH CONTRAST TECHNIQUE: Multidetector CT imaging of the chest, abdomen and pelvis was performed following the  standard protocol during bolus administration of intravenous contrast. CONTRAST:  47mL ISOVUE-300 IOPAMIDOL (ISOVUE-300) INJECTION 61% COMPARISON:  No priors. FINDINGS: CT CHEST FINDINGS Cardiovascular: Heart size is normal. There is no significant pericardial fluid, thickening or pericardial calcification. There is aortic atherosclerosis, as well as atherosclerosis of the great vessels of the mediastinum and the coronary arteries, including calcified atherosclerotic plaque in the left anterior descending coronary artery. Mediastinum/Nodes: Right infrahilar lymphadenopathy measuring up to 16 mm in short axis. Conglomerate nodal mass in the right hilar region measuring 2.3 x 3.4 cm (axial image 29 of series 2). Multiple other enlarged mediastinal lymph nodes, most notable for a paraesophageal lymph node in the inferior aspect of the middle mediastinum measuring 2.2 x 3.8 cm. Mass-like thickening of the esophageal wall at the junction of proximal to middle third of the esophagus, best appreciated on axial image 18 of series 2 and coronal image 60 of series 7. No axillary lymphadenopathy. Lungs/Pleura: There are innumerable pulmonary nodules scattered throughout the lungs bilaterally, with mid to lower lung predominance, highly concerning for widespread metastatic disease. These nodules measure up to 12 mm in diameter in the lower lobes of the lungs bilaterally (axial image 123 of series 6 in the right lower lobe and axial image 120 of series 6 in the left lower lobe). No acute consolidative airspace disease. No pleural effusions. Musculoskeletal: There are no aggressive appearing lytic or blastic lesions noted in the visualized portions of the skeleton. CT ABDOMEN PELVIS FINDINGS Hepatobiliary: There are several hypovascular masses in the liver, largest of which are centered in segment 1 (axial image 54 of series 2) and in segment 2 (axial image 54 of series 2), measuring 4.2 x 5.2 cm and 3.9 x 5.4 cm respectively. No  intra or extrahepatic biliary ductal dilatation. Gallbladder is normal in appearance. Pancreas: No pancreatic mass. No pancreatic ductal dilatation. No pancreatic or peripancreatic fluid or inflammatory changes. Spleen: Unremarkable. Adrenals/Urinary Tract: Bilateral adrenal glands and bilateral kidneys are normal in appearance. No hydroureteronephrosis. Urinary bladder is normal in appearance. Stomach/Bowel: The appearance of the stomach is normal. No pathologic dilatation of small bowel or colon. The appendix is not confidently identified and may be surgically absent. Regardless, there are no inflammatory changes noted adjacent to the cecum to suggest the presence of an acute appendicitis at this  time. Vascular/Lymphatic: Aortic atherosclerosis, without evidence of aneurysm or dissection in the abdominal or pelvic vasculature. No lymphadenopathy noted in the abdomen or pelvis. Reproductive: Prostate gland and seminal vesicles are unremarkable in appearance. Other: No significant volume of ascites.  No pneumoperitoneum. Musculoskeletal: There are no aggressive appearing lytic or blastic lesions noted in the visualized portions of the skeleton. IMPRESSION: 1. 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 The exact source of primary lung malignancy is uncertain. 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 Differential considerations would also include a primary small cell carcinoma of the lung. Further evaluation with endoscopy and PET-CT is recommended for diagnostic and staging purposes. 2. Aortic atherosclerosis, in addition to left anterior descending coronary artery disease. Assessment for potential risk factor modification, dietary therapy or pharmacologic therapy may be  warranted, if clinically indicated. 3. Additional incidental findings, as above. Electronically Signed   By: Vinnie Langton M.D.   On: 11/09/2016 11:15   Ct Abdomen Pelvis W Contrast  Result Date: 11/09/2016 CLINICAL DATA:  77 year old male with history of anorexia and generalized weakness for the past 2 weeks. Diarrhea for 24 hours. Remote history of non-Hodgkin's lymphoma treated in 1994. EXAM: CT CHEST, ABDOMEN, AND PELVIS WITH CONTRAST TECHNIQUE: Multidetector CT imaging of the chest, abdomen and pelvis was performed following the standard protocol during bolus administration of intravenous contrast. CONTRAST:  59mL ISOVUE-300 IOPAMIDOL (ISOVUE-300) INJECTION 61% COMPARISON:  No priors. FINDINGS: CT CHEST FINDINGS Cardiovascular: Heart size is normal. There is no significant pericardial fluid, thickening or pericardial calcification. There is aortic atherosclerosis, as well as atherosclerosis of the great vessels of the mediastinum and the coronary arteries, including calcified atherosclerotic plaque in the left anterior descending coronary artery. Mediastinum/Nodes: Right infrahilar lymphadenopathy measuring up to 16 mm in short axis. Conglomerate nodal mass in the right hilar region measuring 2.3 x 3.4 cm (axial image 29 of series 2). Multiple other enlarged mediastinal lymph nodes, most notable for a paraesophageal lymph node in the inferior aspect of the middle mediastinum measuring 2.2 x 3.8 cm. Mass-like thickening of the esophageal wall at the junction of proximal to middle third of the esophagus, best appreciated on axial image 18 of series 2 and coronal image 60 of series 7. No axillary lymphadenopathy. Lungs/Pleura: There are innumerable pulmonary nodules scattered throughout the lungs bilaterally, with mid to lower lung predominance, highly concerning for widespread metastatic disease. These nodules measure up to 12 mm in diameter in the lower lobes of the lungs bilaterally (axial image 123 of  series 6 in the right lower lobe and axial image 120 of series 6 in the left lower lobe). No acute consolidative airspace disease. No pleural effusions. Musculoskeletal: There are no aggressive appearing lytic or blastic lesions noted in the visualized portions of the skeleton. CT ABDOMEN PELVIS FINDINGS Hepatobiliary: There are several hypovascular masses in the liver, largest of which are centered in segment 1 (axial image 54 of series 2) and in segment 2 (axial image 54 of series 2), measuring 4.2 x 5.2 cm and 3.9 x 5.4 cm respectively. No intra or extrahepatic biliary ductal dilatation. Gallbladder is normal in appearance. Pancreas: No pancreatic mass. No pancreatic ductal dilatation. No pancreatic or peripancreatic fluid or inflammatory changes. Spleen: Unremarkable. Adrenals/Urinary Tract: Bilateral adrenal glands and bilateral kidneys are  normal in appearance. No hydroureteronephrosis. Urinary bladder is normal in appearance. Stomach/Bowel: The appearance of the stomach is normal. No pathologic dilatation of small bowel or colon. The appendix is not confidently identified and may be surgically absent. Regardless, there are no inflammatory changes noted adjacent to the cecum to suggest the presence of an acute appendicitis at this time. Vascular/Lymphatic: Aortic atherosclerosis, without evidence of aneurysm or dissection in the abdominal or pelvic vasculature. No lymphadenopathy noted in the abdomen or pelvis. Reproductive: Prostate gland and seminal vesicles are unremarkable in appearance. Other: No significant volume of ascites.  No pneumoperitoneum. Musculoskeletal: There are no aggressive appearing lytic or blastic lesions noted in the visualized portions of the skeleton. IMPRESSION: 1. 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 The exact source of primary lung malignancy is uncertain.  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 Differential considerations would also include a primary small cell carcinoma of the lung. Further evaluation with endoscopy and PET-CT is recommended for diagnostic and staging purposes. 2. Aortic atherosclerosis, in addition to left anterior descending coronary artery disease. Assessment for potential risk factor modification, dietary therapy or pharmacologic therapy may be warranted, if clinically indicated. 3. Additional incidental findings, as above. Electronically Signed   By: Vinnie Langton M.D.   On: 11/09/2016 11:15   Dg Abd Portable 1v  Result Date: 11/09/2016 CLINICAL DATA:  Diarrhea. EXAM: PORTABLE ABDOMEN - 1 VIEW COMPARISON:  Bowel gas pattern from lumbar spine radiographs 08/15/2015 FINDINGS: No bowel dilatation to suggest obstruction. Air throughout small bowel and transverse colon in the upper abdomen, nondistended. A stool ball distends the rectum spanning 8.3 cm. No evidence of free air. A calcification to the right of L5 may be vascular. Pelvic phleboliths are noted. No acute osseous abnormalities are seen. IMPRESSION: No evidence of bowel obstruction. Increased air within nondistended small bowel and transverse colon in the upper abdomen is nonspecific, may be normal for this patient or can be seen with represent enteritis. Stool ball distends the rectum. Electronically Signed   By: Jeb Levering M.D.   On: 11/09/2016 02:36

## 2016-11-11 NOTE — Interval H&P Note (Signed)
History and Physical Interval Note:  11/11/2016 10:12 AM  Derek Stevens  has presented today for surgery, with the diagnosis of esophageal lesion  The various methods of treatment have been discussed with the patient and family. After consideration of risks, benefits and other options for treatment, the patient has consented to  Procedure(s): ESOPHAGOGASTRODUODENOSCOPY (EGD) (N/A) as a surgical intervention .  The patient's history has been reviewed, patient examined, no change in status, stable for surgery.  I have reviewed the patient's chart and labs.  Questions were answered to the patient's satisfaction.     [name]. Fuller Plan

## 2016-11-11 NOTE — Op Note (Signed)
[facility] Patient Name: Derek Stevens Procedure Date: 11/11/2016 MRN: [id number] Attending MD: Ladene Artist , MD Date of Birth: 12-05-1940 CSN: [id number] Age: 76 Admit Type: Inpatient Procedure:                Upper GI endoscopy Indications:              Abnormal CT of the GI tract Providers:                [name]. Fuller Plan, MD, Burtis Junes, RN, Alfonso Patten,                            Technician Referring MD:             [facility] Medicines:                Fentanyl 50 micrograms IV, Midazolam 4 mg IV Complications:            No immediate complications. Estimated Blood Loss:     Estimated blood loss was minimal. Procedure:                Pre-Anesthesia Assessment:                           - Prior to the procedure, a History and Physical                            was performed, and patient medications and                            allergies were reviewed. The patient's tolerance of                            previous anesthesia was also reviewed. The risks                            and benefits of the procedure and the sedation                            options and risks were discussed with the patient.                            All questions were answered, and informed consent                            was obtained. Prior Anticoagulants: The patient has                            taken no previous anticoagulant or antiplatelet                            agents. ASA Grade Assessment: II - A patient with                            mild systemic disease. After reviewing the risks  and benefits, the patient was deemed in                            satisfactory condition to undergo the procedure.                           After obtaining informed consent, the endoscope was                            passed under direct vision. Throughout the                            procedure, the patient's blood pressure, pulse, and                             oxygen saturations were monitored continuously. The                            was introduced through the mouth, and advanced to                            the second part of duodenum. The upper GI endoscopy                            was somewhat difficult [Reason]. [Solution]. The                            patient tolerated the procedure well. Scope In: Scope Out: Findings:      A fungating mass with oozing was found in the upper third of the       esophagus. It extended from 24-29 cm from the incisors. The EUS was       located at 20 cm. The mass was partially obstructing and       circumferential. Unable to advance past the mass with the standard       endoscope so changed to a pediatric endoscope and was able to advance       past the mass. Biopsies were taken with a cold forceps for histology.      One mild benign-appearing, intrinsic stenosis was found at the       gastroesophageal junction. This measured 1.4 cm (inner diameter) and was       traversed.      The exam of the esophagus was otherwise normal.      A small hiatal hernia was present.      The exam of the stomach was otherwise normal.      The duodenal bulb and second portion of the duodenum were normal. 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                            Biopsied.                           -  Benign-appearing esophageal stenosis.                           - Small hiatal hernia.                           - Normal duodenal bulb and second portion of the                            duodenum. Moderate Sedation:      Moderate (conscious) sedation was administered by the endoscopy nurse       and supervised by the endoscopist. The following parameters were       monitored: oxygen saturation, heart rate, blood pressure, respiratory       rate, EKG, adequacy of pulmonary  ventilation, and response to care.       Total physician intraservice time was 18 minutes. Recommendation:           - Return patient to hospital ward for ongoing care.                           - Full liquid diet indefinitely.                           - Await pathology results.                           - PPI daily.                           - Continue present medications. Procedure Code(s):        --- Professional ---                           [id number], Esophagogastroduodenoscopy, flexible,                            transoral; with biopsy, single or multiple                           99152, Moderate sedation services provided by the                            same physician or other qualified health care                            professional performing the diagnostic or                            therapeutic service that the sedation supports,                            requiring the presence of an independent trained                            observer to assist in the monitoring of the  patient's level of consciousness and physiological                            status; initial 15 minutes of intraservice time,                            patient age 38 years or older Diagnosis Code(s):        --- Professional ---                           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 All rights reserved. The codes documented in this report are preliminary and upon coder review may  be revised to meet current compliance requirements. Ladene Artist, MD 11/11/2016 10:56:06  AM This report has been signed electronically. Number of Addenda: 0

## 2016-11-12 LAB — BASIC METABOLIC PANEL
ANION GAP: 8 (ref 5–15)
BUN: 8 mg/dL (ref 6–20)
CHLORIDE: 100 mmol/L — AB (ref 101–111)
CO2: 25 mmol/L (ref 22–32)
Calcium: 10.1 mg/dL (ref 8.9–10.3)
Creatinine, Ser: 0.84 mg/dL (ref 0.61–1.24)
GFR calc Af Amer: >60 mL/min (ref >60)
Glucose, Bld: 103 mg/dL — ABNORMAL HIGH (ref 65–99)
POTASSIUM: 3.4 mmol/L — AB (ref 3.5–5.1)
Sodium: 133 mmol/L — ABNORMAL LOW (ref 135–145)

## 2016-11-12 LAB — PTH-RELATED PEPTIDE

## 2016-11-12 MED ORDER — ADULT MULTIVITAMIN W/MINERALS CH: 1.0000 | ORAL_TABLET | Freq: Every day | ORAL | 0 refills | Status: AC

## 2016-11-12 MED ORDER — BOOST / RESOURCE BREEZE PO LIQD: 1.0000 | ORAL | 0 refills | Status: DC

## 2016-11-12 MED ORDER — ZOLEDRONIC ACID 4 MG/5ML IV CONC: 4.0000 mg | Freq: Once | INTRAVENOUS | Status: DC

## 2016-11-12 MED ORDER — ENSURE ENLIVE PO LIQD: 237.0000 mL | Freq: Two times a day (BID) | ORAL | 12 refills | Status: AC

## 2016-11-12 MED ORDER — SODIUM CHLORIDE 0.9 % IV SOLN: 4.0000 mg | Freq: Once | INTRAVENOUS | Status: DC

## 2016-11-12 MED ORDER — PANTOPRAZOLE SODIUM 40 MG PO TBEC: 40.0000 mg | DELAYED_RELEASE_TABLET | Freq: Every day | ORAL | 0 refills | Status: AC

## 2016-11-12 NOTE — Discharge Summary (Signed)
Physician Discharge Summary  SANTOSH PETTER ASN:[id number] DOB: 1940/11/01 DOA: 11/08/2016  PCP: Kristine Garbe, MD  Admit date: 11/08/2016 Discharge date: 11/12/2016  Admitted From: Home Disposition:  Home   Recommendations for Outpatient Follow-up:  1. Follow up with PCP in 1- weeks 2. Follow up calcium in 7 days.    Home Health: yes Equipment/Devices: no   Discharge Condition: Stable  CODE STATUS: Full  Diet recommendation: regular   Brief/Interim Summary: This is a 76 yo male who presented with the chief complain of anorexia and generalized weakness. His symptoms have been ongoing for last 2 weeks prior to hospitalization. On initial physical examination, he was hemodynamically stable, his mucous membranes were moist, his lungs were clear to auscultation, heart S1-S2 present and rhythmic, abdomen soft nontender, no lower extremity edema. Sodium 133, potassium 4.0, chloride 94, bicarbonate 26, glucose 120, BUN 17, creatinine 1.20, calcium 13.4, phosphorus 13, -1.8, albumin 4.5, lipase 31, AST 50, ALT 17, white count 8.7, hemoglobin 12.9, hematocrit 30.5, platelets 361. TSH 5.4. EKG was sinus rhythm with premature atrial complexes. Urinalysis negative for infection.  The patient was admitted to the medical floor with remote telemetry monitor with the working diagnosis of hypercalcemia, acute kidney injury rule out underlying malignancy.  1. Hypercalcemia due to suspected malignancy. Patient was admitted to the medical unit with a remote telemetry monitor, he received IV fluids with isotonic saline and IV calcitonin. Patient responded well to medical therapy, discharge calcium 10.1, patient showed improvement in his symptoms. Further workup showed a vitamin D level of 16.3, intact PTH of 34, and PTH related peptide less than 1.1. 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 Multiple enlarged mediastinal lymph nodes. Masslike thickening of the  esophageal wall at the junction of the proximal middle third of the esophagus. Recommend recheck calcium next week patient may need biphosphonate therapy.   2. 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 Patient underwent upper endoscopy findings consistent with partially obstructing, malignant esophageal tumor in the upper third of the esophagus. Biopsies were taken. Diet was advanced to full liquids good toleration. Dr. Benay Spice, was contacted over the phone, the oncology center will contact patient for follow-up next week.  3.  Acute kidney injury. Patient received IV fluids with improvement of kidney function, discharge creatinine normalized. Patient tolerated well for liquid diet.  4. Hypertension. Coreg was continued with good toleration. Lisinopril will be resumed at discharge.   5. Calorie protein malnutrition. Patient was continued on nutritional supplements. Patient was seen by physical therapy, recommendations for home health with home physical therapy.   Discharge Diagnoses:  Active Problems:   Hypercalcemia   Anorexia   Weight loss   AKI (acute kidney injury) ([place])   Essential hypertension   Protein-calorie malnutrition, severe    Discharge Instructions   Allergies as of 11/12/2016   No Known Allergies     Medication List    TAKE these medications   carvedilol 12.5 MG tablet Commonly known as:  COREG Take 12.5 mg by mouth 2 (two) times daily with a meal.   feeding supplement Liqd Take 1 Container by mouth daily.   feeding supplement (ENSURE ENLIVE) Liqd Take 237 mLs by mouth 2 (two) times daily between meals.   lisinopril 20 MG tablet Commonly known as:  PRINIVIL,ZESTRIL Take 20 mg by mouth daily.   multivitamin with minerals Tabs tablet Take 1 tablet by mouth daily. Start taking on:  11/13/2016   pantoprazole 40 MG  tablet Commonly known as:  PROTONIX Take 1 tablet (40 mg total) by mouth daily at 6 (six) AM. Start taking on:  11/13/2016    simvastatin 20 MG tablet Commonly known as:  ZOCOR Take 20 mg by mouth every evening.       No Known Allergies  Consultations:  Gastroenterology    Procedures/Studies: Ct Chest W Contrast  Result Date: 11/09/2016 CLINICAL DATA:  76 year old male with history of anorexia and generalized weakness for the past 2 weeks. Diarrhea for 24 hours. Remote history of non-Hodgkin's lymphoma treated in 1994. EXAM: CT CHEST, ABDOMEN, AND PELVIS WITH CONTRAST TECHNIQUE: Multidetector CT imaging of the chest, abdomen and pelvis was performed following the standard protocol during bolus administration of intravenous contrast. CONTRAST:  67mL ISOVUE-300 IOPAMIDOL (ISOVUE-300) INJECTION 61% COMPARISON:  No priors. FINDINGS: CT CHEST FINDINGS Cardiovascular: Heart size is normal. There is no significant pericardial fluid, thickening or pericardial calcification. There is aortic atherosclerosis, as well as atherosclerosis of the great vessels of the mediastinum and the coronary arteries, including calcified atherosclerotic plaque in the left anterior descending coronary artery. Mediastinum/Nodes: Right infrahilar lymphadenopathy measuring up to 16 mm in short axis. Conglomerate nodal mass in the right hilar region measuring 2.3 x 3.4 cm (axial image 29 of series 2). Multiple other enlarged mediastinal lymph nodes, most notable for a paraesophageal lymph node in the inferior aspect of the middle mediastinum measuring 2.2 x 3.8 cm. Mass-like thickening of the esophageal wall at the junction of proximal to middle third of the esophagus, best appreciated on axial image 18 of series 2 and coronal image 60 of series 7. No axillary lymphadenopathy. Lungs/Pleura: There are innumerable pulmonary nodules scattered throughout the lungs bilaterally, with mid to lower lung predominance, highly concerning for widespread metastatic disease. These nodules measure up to 12 mm in diameter in the lower lobes of the lungs bilaterally  (axial image 123 of series 6 in the right lower lobe and axial image 120 of series 6 in the left lower lobe). No acute consolidative airspace disease. No pleural effusions. Musculoskeletal: There are no aggressive appearing lytic or blastic lesions noted in the visualized portions of the skeleton. CT ABDOMEN PELVIS FINDINGS Hepatobiliary: There are several hypovascular masses in the liver, largest of which are centered in segment 1 (axial image 54 of series 2) and in segment 2 (axial image 54 of series 2), measuring 4.2 x 5.2 cm and 3.9 x 5.4 cm respectively. No intra or extrahepatic biliary ductal dilatation. Gallbladder is normal in appearance. Pancreas: No pancreatic mass. No pancreatic ductal dilatation. No pancreatic or peripancreatic fluid or inflammatory changes. Spleen: Unremarkable. Adrenals/Urinary Tract: Bilateral adrenal glands and bilateral kidneys are normal in appearance. No hydroureteronephrosis. Urinary bladder is normal in appearance. Stomach/Bowel: The appearance of the stomach is normal. No pathologic dilatation of small bowel or colon. The appendix is not confidently identified and may be surgically absent. Regardless, there are no inflammatory changes noted adjacent to the cecum to suggest the presence of an acute appendicitis at this time. Vascular/Lymphatic: Aortic atherosclerosis, without evidence of aneurysm or dissection in the abdominal or pelvic vasculature. No lymphadenopathy noted in the abdomen or pelvis. Reproductive: Prostate gland and seminal vesicles are unremarkable in appearance. Other: No significant volume of ascites.  No pneumoperitoneum. Musculoskeletal: There are no aggressive appearing lytic or blastic lesions noted in the visualized portions of the skeleton. IMPRESSION: 1. Today's study demonstrates widespread metastatic disease to the lungs and the liver, in addition to right hilar and mediastinal lymph  nodes, as detailed above. The exact source of primary lung  malignancy is uncertain. 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 Differential considerations would also include a primary small cell carcinoma of the lung. Further evaluation with endoscopy and PET-CT is recommended for diagnostic and staging purposes. 2. Aortic atherosclerosis, in addition to left anterior descending coronary artery disease. Assessment for potential risk factor modification, dietary therapy or pharmacologic therapy may be warranted, if clinically indicated. 3. Additional incidental findings, as above. Electronically Signed   By: Vinnie Langton M.D.   On: 11/09/2016 11:15   Ct Abdomen Pelvis W Contrast  Result Date: 11/09/2016 CLINICAL DATA:  76 year old male with history of anorexia and generalized weakness for the past 2 weeks. Diarrhea for 24 hours. Remote history of non-Hodgkin's lymphoma treated in 1994. EXAM: CT CHEST, ABDOMEN, AND PELVIS WITH CONTRAST TECHNIQUE: Multidetector CT imaging of the chest, abdomen and pelvis was performed following the standard protocol during bolus administration of intravenous contrast. CONTRAST:  3mL ISOVUE-300 IOPAMIDOL (ISOVUE-300) INJECTION 61% COMPARISON:  No priors. FINDINGS: CT CHEST FINDINGS Cardiovascular: Heart size is normal. There is no significant pericardial fluid, thickening or pericardial calcification. There is aortic atherosclerosis, as well as atherosclerosis of the great vessels of the mediastinum and the coronary arteries, including calcified atherosclerotic plaque in the left anterior descending coronary artery. Mediastinum/Nodes: Right infrahilar lymphadenopathy measuring up to 16 mm in short axis. Conglomerate nodal mass in the right hilar region measuring 2.3 x 3.4 cm (axial image 29 of series 2). Multiple other enlarged mediastinal lymph nodes, most notable for a paraesophageal lymph node in the inferior  aspect of the middle mediastinum measuring 2.2 x 3.8 cm. Mass-like thickening of the esophageal wall at the junction of proximal to middle third of the esophagus, best appreciated on axial image 18 of series 2 and coronal image 60 of series 7. No axillary lymphadenopathy. Lungs/Pleura: There are innumerable pulmonary nodules scattered throughout the lungs bilaterally, with mid to lower lung predominance, highly concerning for widespread metastatic disease. These nodules measure up to 12 mm in diameter in the lower lobes of the lungs bilaterally (axial image 123 of series 6 in the right lower lobe and axial image 120 of series 6 in the left lower lobe). No acute consolidative airspace disease. No pleural effusions. Musculoskeletal: There are no aggressive appearing lytic or blastic lesions noted in the visualized portions of the skeleton. CT ABDOMEN PELVIS FINDINGS Hepatobiliary: There are several hypovascular masses in the liver, largest of which are centered in segment 1 (axial image 54 of series 2) and in segment 2 (axial image 54 of series 2), measuring 4.2 x 5.2 cm and 3.9 x 5.4 cm respectively. No intra or extrahepatic biliary ductal dilatation. Gallbladder is normal in appearance. Pancreas: No pancreatic mass. No pancreatic ductal dilatation. No pancreatic or peripancreatic fluid or inflammatory changes. Spleen: Unremarkable. Adrenals/Urinary Tract: Bilateral adrenal glands and bilateral kidneys are normal in appearance. No hydroureteronephrosis. Urinary bladder is normal in appearance. Stomach/Bowel: The appearance of the stomach is normal. No pathologic dilatation of small bowel or colon. The appendix is not confidently identified and may be surgically absent. Regardless, there are no inflammatory changes noted adjacent to the cecum to suggest the presence of an acute appendicitis at this time. Vascular/Lymphatic: Aortic atherosclerosis, without evidence of aneurysm or dissection in the abdominal or pelvic  vasculature. No lymphadenopathy noted in the abdomen or  pelvis. Reproductive: Prostate gland and seminal vesicles are unremarkable in appearance. Other: No significant volume of ascites.  No pneumoperitoneum. Musculoskeletal: There are no aggressive appearing lytic or blastic lesions noted in the visualized portions of the skeleton. IMPRESSION: 1. 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 The exact source of primary lung malignancy is uncertain. 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 Differential considerations would also include a primary small cell carcinoma of the lung. Further evaluation with endoscopy and PET-CT is recommended for diagnostic and staging purposes. 2. Aortic atherosclerosis, in addition to left anterior descending coronary artery disease. Assessment for potential risk factor modification, dietary therapy or pharmacologic therapy may be warranted, if clinically indicated. 3. Additional incidental findings, as above. Electronically Signed   By: Vinnie Langton M.D.   On: 11/09/2016 11:15   Dg Abd Portable 1v  Result Date: 11/09/2016 CLINICAL DATA:  Diarrhea. EXAM: PORTABLE ABDOMEN - 1 VIEW COMPARISON:  Bowel gas pattern from lumbar spine radiographs 08/15/2015 FINDINGS: No bowel dilatation to suggest obstruction. Air throughout small bowel and transverse colon in the upper abdomen, nondistended. A stool ball distends the rectum spanning 8.3 cm. No evidence of free air. A calcification to the right of L5 may be vascular. Pelvic phleboliths are noted. No acute osseous abnormalities are seen. IMPRESSION: No evidence of bowel obstruction. Increased air within nondistended small bowel and transverse colon in the upper abdomen is nonspecific, may be normal for  this patient or can be seen with represent enteritis. Stool ball distends the rectum. Electronically Signed   By: Jeb Levering M.D.   On: 11/09/2016 02:36    (Echo, Carotid, EGD, Colonoscopy, ERCP)    Subjective: Patient feeling better, no nausea or vomiting, tolerating po, improved strength.   Discharge Exam: Vitals:   11/11/16 2046 11/12/16 0601  BP: 136/79 132/81  Pulse: 62 69  Resp: 18 16  Temp: 97.6 F (36.4 C) 98.6 F (37 C)   Vitals:   11/11/16 1128 11/11/16 1408 11/11/16 2046 11/12/16 0601  BP: (!) 146/84 (!) 157/95 136/79 132/81  Pulse: (!) 58 64 62 69  Resp: 20 18 18 16   Temp:  97.6 F (36.4 C) 97.6 F (36.4 C) 98.6 F (37 C)  TempSrc:  Oral Oral Oral  SpO2: 97% 99% 98% 100%  Weight:      Height:        General: Pt is alert, awake, not in acute distress Cardiovascular: RRR, S1/S2 +, no rubs, no gallops Respiratory: CTA bilaterally, no wheezing, no rhonchi Abdominal: Soft, NT, ND, bowel sounds + Extremities: no edema, no cyanosis    The results of significant diagnostics from this hospitalization (including imaging, microbiology, ancillary and laboratory) are listed below for reference.     Microbiology: No results found for this or any previous visit (from the past 240 hour(s)).   Labs: BNP (last 3 results) No results for input(s): BNP in the last 8760 hours. Basic Metabolic Panel:  Recent Labs Lab 11/08/16 1019  11/08/16 1020 11/08/16 1630 11/09/16 0453 11/10/16 0832 11/11/16 0526 11/12/16 0357  NA  --   --  133*  --  138 136 135 133*  K  --   --  4.0  --  3.8 3.2* 3.8 3.4*  CL  --   --  94*  --  101 101 105 100*  CO2  --   --  26  --  27 27 25 25   GLUCOSE  --   --  120*  --  116* 113* 95 103*  BUN  --   --  17  --  13 8 7 8   CREATININE  --   < > 1.28* 1.22 0.97 0.83 0.90 0.84  CALCIUM  --   < > 13.4*  --  11.7* 10.1  10.1 9.9 10.1  MG 1.8  --   --   --   --   --  1.3*  --   PHOS  --   --   --  2.5  --   --   --   --   < > =  values in this interval not displayed. Liver Function Tests:  Recent Labs Lab 11/08/16 1020  AST 50*  ALT 17  ALKPHOS 69  BILITOT 0.8  PROT 8.5*  ALBUMIN 4.5    Recent Labs Lab 11/08/16 1020  LIPASE 31   No results for input(s): AMMONIA in the last 168 hours. CBC:  Recent Labs Lab 11/08/16 1020 11/08/16 1822 11/10/16 0832  WBC 8.7 9.4 6.9  NEUTROABS  --   --  4.9  HGB 12.9* 13.3 11.2*  HCT 38.5* 39.1 33.2*  MCV 68.4* 70.6* 69.2*  PLT 361 236 301   Cardiac Enzymes: No results for input(s): CKTOTAL, CKMB, CKMBINDEX, TROPONINI in the last 168 hours. BNP: Invalid input(s): POCBNP CBG: No results for input(s): GLUCAP in the last 168 hours. D-Dimer No results for input(s): DDIMER in the last 72 hours. Hgb A1c No results for input(s): HGBA1C in the last 72 hours. Lipid Profile No results for input(s): CHOL, HDL, LDLCALC, TRIG, CHOLHDL, LDLDIRECT in the last 72 hours. Thyroid function studies No results for input(s): TSH, T4TOTAL, T3FREE, THYROIDAB in the last 72 hours.  Invalid input(s): FREET3 Anemia work up No results for input(s): VITAMINB12, FOLATE, FERRITIN, TIBC, IRON, RETICCTPCT in the last 72 hours. Urinalysis    Component Value Date/Time   COLORURINE YELLOW 11/08/2016 1310   APPEARANCEUR CLEAR 11/08/2016 1310   LABSPEC 1.013 11/08/2016 1310   PHURINE 5.0 11/08/2016 1310   GLUCOSEU NEGATIVE 11/08/2016 1310   HGBUR NEGATIVE 11/08/2016 1310   BILIRUBINUR NEGATIVE 11/08/2016 1310   KETONESUR 5 (A) 11/08/2016 1310   PROTEINUR NEGATIVE 11/08/2016 1310   UROBILINOGEN 0.2 02/10/2011 1249   NITRITE NEGATIVE 11/08/2016 1310   LEUKOCYTESUR NEGATIVE 11/08/2016 1310   Sepsis Labs Invalid input(s): PROCALCITONIN,  WBC,  LACTICIDVEN Microbiology No results found for this or any previous visit (from the past 240 hour(s)).   Time coordinating discharge: 45  minutes  SIGNED:   Tawni Millers, MD  [facility] 11/12/2016, 12:24 PM Pager    If 7PM-7AM, please contact night-coverage [email or web address] Password [email or web address]

## 2016-11-12 NOTE — Progress Notes (Signed)
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 Awaiting pathology. Full liquids and nutritional supplements. I don't think he can tolerate swallowing solid foods given the severity of the stenosis. Oncology consult.

## 2016-11-12 NOTE — Care Management Note (Signed)
Case Management Note  Patient Details  Name: Derek Stevens MRN: [id number] Date of Birth: [birth year]  Subjective/Objective:  Hypercalcemia due suspected malignancy                  Action/Plan: Discharge Planning: NCM spoke to pt's and gave permission to speak to wife. Spoke to wife via phone. Offered choice for [facility]. Wife requesting Alvis Lemmings for HHPT. [facility] Liaison with new referral. Pt has RW and bedside commode at home.   PCP Lin Landsman MD  Expected Discharge Date:  11/12/16               Expected Discharge Plan:  [place]  In-House Referral:  NA  Discharge planning Services  CM Consult  Post Acute Care Choice:  Home Health Choice offered to:  Spouse  DME Arranged:  N/A DME Agency:  NA  HH Arranged:  PT HH Agency:  [facility]  Status of Service:  Completed, signed off  If discussed at [place] of Stay Meetings, dates discussed:    Additional Comments:  Erenest Rasher, RN 11/12/2016, 3:49 PM

## 2016-11-13 ENCOUNTER — Encounter (HOSPITAL_COMMUNITY): Payer: Self-pay | Admitting: Gastroenterology

## 2016-11-14 ENCOUNTER — Other Ambulatory Visit: Payer: Self-pay | Admitting: *Deleted

## 2016-11-14 DIAGNOSIS — R55 Syncope and collapse: Secondary | ICD-10-CM

## 2016-11-14 DIAGNOSIS — I10 Essential (primary) hypertension: Secondary | ICD-10-CM

## 2016-11-15 ENCOUNTER — Other Ambulatory Visit: Payer: Self-pay

## 2016-11-15 DIAGNOSIS — C159 Malignant neoplasm of esophagus, unspecified: Secondary | ICD-10-CM

## 2016-11-15 LAB — PTH-RELATED PEPTIDE: PTH-related peptide: <1.1 pmol/L

## 2016-11-16 ENCOUNTER — Telehealth: Payer: Self-pay | Admitting: Oncology

## 2016-11-16 NOTE — Telephone Encounter (Signed)
Appt has been scheduled for the pt to see Dr. Benay Spice on 4/6 at 1230pm, labs at 12pm. Spoke to the pt's daughter-in-law and gave the appt date and time. Agreed to the appt date and time.

## 2016-11-17 ENCOUNTER — Other Ambulatory Visit (HOSPITAL_BASED_OUTPATIENT_CLINIC_OR_DEPARTMENT_OTHER): Payer: Medicare HMO

## 2016-11-17 ENCOUNTER — Encounter (HOSPITAL_COMMUNITY): Payer: Self-pay

## 2016-11-17 ENCOUNTER — Ambulatory Visit (HOSPITAL_BASED_OUTPATIENT_CLINIC_OR_DEPARTMENT_OTHER): Payer: Medicare HMO | Admitting: Oncology

## 2016-11-17 ENCOUNTER — Inpatient Hospital Stay (HOSPITAL_COMMUNITY)
Admission: AD | Admit: 2016-11-17 | Discharge: 2016-11-22 | DRG: 640 | Disposition: A | Discharge status: Hospice | Payer: Medicare HMO | Source: Ambulatory Visit | Attending: Internal Medicine | Admitting: Internal Medicine

## 2016-11-17 ENCOUNTER — Ambulatory Visit: Payer: Medicare HMO

## 2016-11-17 DIAGNOSIS — C771 Secondary and unspecified malignant neoplasm of intrathoracic lymph nodes: Secondary | ICD-10-CM | POA: Diagnosis present

## 2016-11-17 DIAGNOSIS — F05 Delirium due to known physiological condition: Secondary | ICD-10-CM | POA: Diagnosis present

## 2016-11-17 DIAGNOSIS — C787 Secondary malignant neoplasm of liver and intrahepatic bile duct: Secondary | ICD-10-CM | POA: Diagnosis present

## 2016-11-17 DIAGNOSIS — R634 Abnormal weight loss: Secondary | ICD-10-CM | POA: Diagnosis not present

## 2016-11-17 DIAGNOSIS — N179 Acute kidney failure, unspecified: Secondary | ICD-10-CM | POA: Diagnosis present

## 2016-11-17 DIAGNOSIS — Z79899 Other long term (current) drug therapy: Secondary | ICD-10-CM

## 2016-11-17 DIAGNOSIS — R4182 Altered mental status, unspecified: Secondary | ICD-10-CM | POA: Diagnosis not present

## 2016-11-17 DIAGNOSIS — C7800 Secondary malignant neoplasm of unspecified lung: Secondary | ICD-10-CM | POA: Diagnosis present

## 2016-11-17 DIAGNOSIS — Z681 Body mass index (BMI) 19 or less, adult: Secondary | ICD-10-CM

## 2016-11-17 DIAGNOSIS — E860 Dehydration: Secondary | ICD-10-CM

## 2016-11-17 DIAGNOSIS — Z66 Do not resuscitate: Secondary | ICD-10-CM | POA: Diagnosis not present

## 2016-11-17 DIAGNOSIS — R55 Syncope and collapse: Secondary | ICD-10-CM

## 2016-11-17 DIAGNOSIS — Z8571 Personal history of Hodgkin lymphoma: Secondary | ICD-10-CM | POA: Diagnosis not present

## 2016-11-17 DIAGNOSIS — B3781 Candidal esophagitis: Secondary | ICD-10-CM | POA: Diagnosis not present

## 2016-11-17 DIAGNOSIS — R451 Restlessness and agitation: Secondary | ICD-10-CM | POA: Diagnosis not present

## 2016-11-17 DIAGNOSIS — Z9221 Personal history of antineoplastic chemotherapy: Secondary | ICD-10-CM | POA: Diagnosis not present

## 2016-11-17 DIAGNOSIS — E876 Hypokalemia: Secondary | ICD-10-CM | POA: Diagnosis present

## 2016-11-17 DIAGNOSIS — I10 Essential (primary) hypertension: Secondary | ICD-10-CM | POA: Diagnosis present

## 2016-11-17 DIAGNOSIS — D72829 Elevated white blood cell count, unspecified: Secondary | ICD-10-CM

## 2016-11-17 DIAGNOSIS — C153 Malignant neoplasm of upper third of esophagus: Secondary | ICD-10-CM

## 2016-11-17 DIAGNOSIS — K219 Gastro-esophageal reflux disease without esophagitis: Secondary | ICD-10-CM | POA: Diagnosis present

## 2016-11-17 DIAGNOSIS — Z8673 Personal history of transient ischemic attack (TIA), and cerebral infarction without residual deficits: Secondary | ICD-10-CM | POA: Diagnosis not present

## 2016-11-17 DIAGNOSIS — G9341 Metabolic encephalopathy: Secondary | ICD-10-CM

## 2016-11-17 DIAGNOSIS — R1310 Dysphagia, unspecified: Secondary | ICD-10-CM | POA: Diagnosis present

## 2016-11-17 DIAGNOSIS — R64 Cachexia: Secondary | ICD-10-CM | POA: Diagnosis present

## 2016-11-17 DIAGNOSIS — R509 Fever, unspecified: Secondary | ICD-10-CM | POA: Diagnosis present

## 2016-11-17 DIAGNOSIS — Z720 Tobacco use: Secondary | ICD-10-CM

## 2016-11-17 DIAGNOSIS — R400 Somnolence: Secondary | ICD-10-CM | POA: Diagnosis not present

## 2016-11-17 DIAGNOSIS — J45909 Unspecified asthma, uncomplicated: Secondary | ICD-10-CM | POA: Diagnosis present

## 2016-11-17 DIAGNOSIS — Z923 Personal history of irradiation: Secondary | ICD-10-CM | POA: Diagnosis not present

## 2016-11-17 DIAGNOSIS — E43 Unspecified severe protein-calorie malnutrition: Secondary | ICD-10-CM | POA: Diagnosis present

## 2016-11-17 DIAGNOSIS — Z8572 Personal history of non-Hodgkin lymphomas: Secondary | ICD-10-CM | POA: Diagnosis not present

## 2016-11-17 DIAGNOSIS — R630 Anorexia: Secondary | ICD-10-CM

## 2016-11-17 DIAGNOSIS — Z801 Family history of malignant neoplasm of trachea, bronchus and lung: Secondary | ICD-10-CM

## 2016-11-17 LAB — CBC
HCT: 39.1 % (ref 39.0–52.0)
HEMOGLOBIN: 13.3 g/dL (ref 13.0–17.0)
MCH: 23.6 pg — AB (ref 26.0–34.0)
MCHC: 34 g/dL (ref 30.0–36.0)
MCV: 69.4 fL — ABNORMAL LOW (ref 78.0–100.0)
PLATELETS: 283 10*3/uL (ref 150–400)
RBC: 5.63 MIL/uL (ref 4.22–5.81)
RDW: 14.4 % (ref 11.5–15.5)
WBC: 8.1 10*3/uL (ref 4.0–10.5)

## 2016-11-17 LAB — CREATININE, SERUM
CREATININE: 1.08 mg/dL (ref 0.61–1.24)
GFR calc Af Amer: >60 mL/min (ref >60)
GFR calc non Af Amer: >60 mL/min (ref >60)

## 2016-11-17 LAB — COMPREHENSIVE METABOLIC PANEL
ALBUMIN: 3.7 g/dL (ref 3.5–5.0)
ALK PHOS: 69 U/L (ref 40–150)
ALT: 17 U/L (ref 0–55)
ANION GAP: 11 meq/L (ref 3–11)
AST: 50 U/L — ABNORMAL HIGH (ref 5–34)
BILIRUBIN TOTAL: 0.64 mg/dL (ref 0.20–1.20)
BUN: 15.1 mg/dL (ref 7.0–26.0)
CALCIUM: 14.8 mg/dL — AB (ref 8.4–10.4)
CO2: 27 mEq/L (ref 22–29)
CREATININE: 1.1 mg/dL (ref 0.7–1.3)
Chloride: 99 mEq/L (ref 98–109)
EGFR: 76 mL/min/{1.73_m2} — AB (ref >90)
Glucose: 95 mg/dl (ref 70–140)
Potassium: 3.8 mEq/L (ref 3.5–5.1)
Sodium: 136 mEq/L (ref 136–145)
TOTAL PROTEIN: 7.7 g/dL (ref 6.4–8.3)

## 2016-11-17 MED ORDER — ZOLEDRONIC ACID 4 MG/5ML IV CONC
4.0000 mg | Freq: Once | INTRAVENOUS | Status: AC
  Administered 2016-11-18: 4 mg via INTRAVENOUS
  Filled 2016-11-17 (×2): qty 5

## 2016-11-17 MED ORDER — ADULT MULTIVITAMIN W/MINERALS CH
1.0000 | ORAL_TABLET | Freq: Every day | ORAL | Status: DC
  Administered 2016-11-17 – 2016-11-18 (×2): 1 via ORAL
  Filled 2016-11-17 (×4): qty 1

## 2016-11-17 MED ORDER — PANTOPRAZOLE SODIUM 40 MG PO TBEC
40.0000 mg | DELAYED_RELEASE_TABLET | Freq: Every day | ORAL | Status: DC
  Administered 2016-11-18 – 2016-11-22 (×3): 40 mg via ORAL
  Filled 2016-11-17 (×3): qty 1

## 2016-11-17 MED ORDER — SODIUM CHLORIDE 0.9% FLUSH
3.0000 mL | Freq: Two times a day (BID) | INTRAVENOUS | Status: DC
  Administered 2016-11-17 – 2016-11-21 (×4): 3 mL via INTRAVENOUS

## 2016-11-17 MED ORDER — ENSURE ENLIVE PO LIQD: 237.0000 mL | Freq: Two times a day (BID) | ORAL | Status: DC

## 2016-11-17 MED ORDER — ENOXAPARIN SODIUM 40 MG/0.4ML ~~LOC~~ SOLN
40.0000 mg | SUBCUTANEOUS | Status: DC
  Administered 2016-11-17 – 2016-11-21 (×5): 40 mg via SUBCUTANEOUS
  Filled 2016-11-17 (×5): qty 0.4

## 2016-11-17 MED ORDER — CARVEDILOL 12.5 MG PO TABS
12.5000 mg | ORAL_TABLET | Freq: Two times a day (BID) | ORAL | Status: DC
  Administered 2016-11-18 (×2): 12.5 mg via ORAL
  Filled 2016-11-17 (×3): qty 1

## 2016-11-17 MED ORDER — CALCITONIN (SALMON) 200 UNIT/ML IJ SOLN
100.0000 [IU] | Freq: Two times a day (BID) | INTRAMUSCULAR | Status: AC
  Administered 2016-11-17 – 2016-11-18 (×3): 100 [IU] via INTRAMUSCULAR
  Filled 2016-11-17 (×3): qty 0.5

## 2016-11-17 MED ORDER — SODIUM CHLORIDE 0.9 % IV SOLN
INTRAVENOUS | Status: AC
  Administered 2016-11-17: 19:00:00 via INTRAVENOUS

## 2016-11-17 MED ORDER — LISINOPRIL 20 MG PO TABS
20.0000 mg | ORAL_TABLET | Freq: Every day | ORAL | Status: DC
  Administered 2016-11-17 – 2016-11-18 (×2): 20 mg via ORAL
  Filled 2016-11-17 (×3): qty 1

## 2016-11-17 NOTE — H&P (Addendum)
History and Physical  Derek Stevens MGQ:[id number] DOB: 1941/02/26 DOA: 11/17/2016  Referring physician: EDP PCP: Kristine Garbe, MD    Direct admission from cancer center  Chief Complaint: hypercalcemia  HPI: Derek Stevens is a 76 y.o. 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 He was also treated for hypercacemia with caltitonin, his calcium was 10 at time of discharge on 4/1, today he is seen in oncology office by Dr Learta Codding, he is found to be a little confused and calcium level at 14.8, he is sent for direct admission.  He denies fever, no pain, no cough, denies dysphagia. No constipation, no diarrhea. No dysuria.    Review of Systems:  Detail per HPI, Review of systems are otherwise negative  Past Medical History:  Diagnosis Date  . Asthma   . Cancer ([place])    lymphoma-1994  . GERD (gastroesophageal reflux disease)   . Hypertension   . Murmur, heart   . Non-Hodgkin lymphoma ([place])   . Stroke [facility]) 12/17/2010   Past Surgical History:  Procedure Laterality Date  . ANGIOPLASTY    . ESOPHAGOGASTRODUODENOSCOPY N/A 11/11/2016   Procedure: ESOPHAGOGASTRODUODENOSCOPY (EGD);  Surgeon: Ladene Artist, MD;  Location: [facility];  Service: Endoscopy;  Laterality: N/A;   Social History:  reports that he quit smoking about 45 years ago. He uses smokeless tobacco. He reports that he does not drink alcohol or use drugs. Patient lives at home & is able to participate in activities of daily living independently   No Known Allergies  Family History  Problem Relation Age of Onset  . Lung cancer Father   . Lung cancer Brother   . Colon cancer Neg Hx   . Esophageal cancer Neg Hx   . Rectal cancer Neg Hx   . Stomach cancer Neg Hx       Prior to Admission medications   Medication Sig Start Date End Date Taking?  Authorizing Provider  carvedilol (COREG) 12.5 MG tablet Take 12.5 mg by mouth 2 (two) times daily with a meal.    Historical Provider, MD  feeding supplement, ENSURE ENLIVE, (ENSURE ENLIVE) LIQD Take 237 mLs by mouth 2 (two) times daily between meals. 11/12/16   Mauricio Gerome Apley, MD  lisinopril (PRINIVIL,ZESTRIL) 20 MG tablet Take 20 mg by mouth daily.    Historical Provider, MD  Multiple Vitamin (MULTIVITAMIN WITH MINERALS) TABS tablet Take 1 tablet by mouth daily. 11/13/16   Mauricio Gerome Apley, MD  pantoprazole (PROTONIX) 40 MG tablet Take 1 tablet (40 mg total) by mouth daily at 6 (six) AM. 11/13/16   Mauricio Gerome Apley, MD  simvastatin (ZOCOR) 20 MG tablet Take 20 mg by mouth every evening.  03/10/16   Historical Provider, MD    Physical Exam: BP (!) 174/87 (BP Location: Right Arm)   Pulse 65   Temp 98.2 F (36.8 C) (Oral)   Resp 20   SpO2 98%   General:  Thin, NAD, slight confusion, able to be redirected quickly Eyes: PERRL, dry oral mucosa  ENT: unremarkable Neck: supple, no JVD Cardiovascular: RRR Respiratory: CTABL Abdomen: soft/ND/ND, positive bowel sounds Skin: no rash Musculoskeletal:  No edema Psychiatric: calm/cooperative Neurologic: no focal findings            Labs on Admission:  Basic Metabolic Panel:  Recent Labs Lab 11/11/16 0526 11/12/16  0357 11/17/16 1150  NA 135 133* 136  K 3.8 3.4* 3.8  CL 105 100*  --   CO2 25 25 27   GLUCOSE 95 103* 95  BUN 7 8 15.1  CREATININE 0.90 0.84 1.1  CALCIUM 9.9 10.1 14.8*  MG 1.3*  --   --    Liver Function Tests:  Recent Labs Lab 11/17/16 1150  AST 50*  ALT 17  ALKPHOS 69  BILITOT 0.64  PROT 7.7  ALBUMIN 3.7   No results for input(s): LIPASE, AMYLASE in the last 168 hours. No results for input(s): AMMONIA in the last 168 hours. CBC: No results for input(s): WBC, NEUTROABS, HGB, HCT, MCV, PLT in the last 168 hours. Cardiac Enzymes: No results for input(s): CKTOTAL, CKMB, CKMBINDEX, TROPONINI in  the last 168 hours.  BNP (last 3 results) No results for input(s): BNP in the last 8760 hours.  ProBNP (last 3 results) No results for input(s): PROBNP in the last 8760 hours.  CBG: No results for input(s): GLUCAP in the last 168 hours.  Radiological Exams on Admission: No results found.    Assessment/Plan Present on Admission: . Cancer of proximal third of esophagus ([place]) . Hypercalcemia  Hypercalcemia from malignancy: with mild metabolic encephalopathy  Bisphosphonate, hydration, calcitonin, repeat labs , expect confusion resolved with correction of hypercalcemia Will check ua as well.  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 post an endoscopic biopsy 11/11/2016 CTs of the chest, abdomen, and pelvis 11/09/2016-upper esophagus mass, mediastinal/hilar lymphadenopathy, lung and liver metastases Defer to oncology  DVT prophylaxis: lovenox Diet: heart healthy, patient denies dysphagia   Consultants: oncology Dr Learta Codding ( he will see patient on Monday )  Code Status: full ,  I have discussed with the patient , he states he does not want to be resuscitated, but family wants to keep him full code for now due to current confusion state, they want to discuss with him when he is not confused which I think is reasonable.  Family Communication:  Patient and sister at bedside  Disposition Plan: admit to med tele, likely will need to be in the hospital for 2-3 days, pending calcium level. Physical therapy ordered  Time spent: 67mins  Milisa Kimbell MD, PhD [facility] Pager [phone] If 7PM-7AM, please contact night-coverage at [email or web address], password [facility]

## 2016-11-17 NOTE — Progress Notes (Signed)
Derek Stevens Patient Consult   Referring MD: Lin Landsman, [place] [address], [place] [address]   Derek Stevens 76 y.o.  Feb 09, 1941    Reason for Referral: Esophagus cancer   HPI: Mr. Derek Stevens is here today with his wife and daughter. He was admitted on 11/08/2016 with anorexia and generalized weakness. He also reported a history of diarrhea. He was noted to have hypercalcemia and RBC microcytosis. He was admitted for further evaluation. He was treated with intravenous hydration and calcitonin. The calcium was down to 10.1 on 11/12/2016.  CTs of the chest, abdomen, and pelvis on 11/09/2016 revealed right hilar lymphadenopathy, enlarged mediastinal nodes, and masslike thickening at the junction of the proximal and middle third of the esophagus. Innumerable pulmonary nodules bilaterally. Several hypovascular liver masses. No pancreas mass The gastroenterology service was consulted and he was taken to an upper endoscopy by Dr. Fuller Plan on 11/11/2016. A fungating mass was noted in the upper 30 esophagus ending from 24-29 cm from the incisors. The mass was partially obstructing. A benign-appearing stenosis was found at the GE junction. The mass was biopsied. The pathology ([id number]) revealed squamous cell carcinoma.  Past Medical History:  Diagnosis Date  . Asthma   . Esophagus cancer-squamous cell  11/11/2016 .     Marland Kitchen GERD (gastroesophageal reflux disease)   . Hypertension   . Murmur, heart   . Hodgkin's lymphoma-status post chemotherapy/radiation  1994   . Stroke ([place]) 12/17/2010    .  Hypercalcemia                                                                                                                   11/08/2016  Past Surgical History:  Procedure Laterality Date  . ANGIOPLASTY    . ESOPHAGOGASTRODUODENOSCOPY N/A 11/11/2016   Procedure: ESOPHAGOGASTRODUODENOSCOPY (EGD);  Surgeon: Ladene Artist, MD;  Location: [facility];  Service:  Endoscopy;  Laterality: N/A;    Medications: Reviewed  Allergies: No Known Allergies  Family history: His mother had breast cancer. His father had lung cancer and was a smoker. A brother had "stomach "cancer. He had 6 siblings. He has 4 children. No other family history of cancer.  Social History:   He lives with his wife in [place]. 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 He also worked at [employer]. He quit smoking cigars 26 years ago. He has a history of heavy alcohol use and quit in 2016  ROS:   Positives include: Anorexia, intermittent "dizziness "., Malaise upper abdominal pain, constipation   A complete ROS was otherwise negative.  Physical Exam:  Blood pressure (!) 162/94, pulse 76, temperature 98.2 F (36.8 C), temperature source Oral, resp. rate 17, height 5\' 6"  (1.676 m), weight 119 lb 11.2 oz (54.3 kg), SpO2 100 %.  HEENT: Edentulous, oropharynx without visible mass, neck without mass Lungs: Clear bilaterally, no respiratory distress Cardiac: Regular rate and rhythm Abdomen: No hepatosplenomegaly, no mass, nontender GU: Testes without mass  Vascular: No leg edema Lymph nodes: No cervical, supraclavicular, axillary, or inguinal nodes Neurologic: Alert, follows commands, partially oriented, difficulty with memory recall Skin: No rash, diminished skin turgor Musculoskeletal: No spine tenderness   LAB:  CBC  Lab Results  Component Value Date   WBC 6.9 11/10/2016   HGB 11.2 (L) 11/10/2016   HCT 33.2 (L) 11/10/2016   MCV 69.2 (L) 11/10/2016   PLT 301 11/10/2016   NEUTROABS 4.9 11/10/2016     CMP      Component Value Date/Time   NA 136 11/17/2016 1150   K 3.8 11/17/2016 1150   CL 100 (L) 11/12/2016 0357   CO2 27 11/17/2016 1150   GLUCOSE 95 11/17/2016 1150   BUN 15.1 11/17/2016 1150   CREATININE 1.1 11/17/2016 1150   CALCIUM 14.8 (HH) 11/17/2016 1150   PROT 7.7 11/17/2016 1150   ALBUMIN 3.7 11/17/2016 1150   AST 50 (H) 11/17/2016  1150   ALT 17 11/17/2016 1150   ALKPHOS 69 11/17/2016 1150   BILITOT 0.64 11/17/2016 1150   GFRNONAA >60 11/12/2016 0357   GFRAA >60 11/12/2016 0357      Imaging:  CT images 11/09/2016-reviewed   Assessment/Plan:   1. 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 Hypercalcemia of malignancy  3.   Dehydration/altered mental status secondary to #2  4.    Hodgkin's lymphoma-1994  5.    Hypertension  6.   Anorexia/weight loss  7.   History of tobacco and alcohol use   Disposition:   Mr. Derek Stevens has been diagnosed with squamous cell carcinoma of the esophagus. He has metastatic esophagus cancer. I discussed the diagnosis and treatment options with Mr. Derek Stevens and his family. He understands no therapy will be curative. I recommend a course of systemic chemotherapy with the hope of improving his performance status, controlling the hypercalcemia, and extending his survival.  He is currently symptomatic from hypercalcemia of malignancy. He will be admitted for acute management of hypercalcemia.  I will see him in the hospital next week and arrange for outpatient follow-up. I will discuss details of chemotherapy after Mr. Derek Stevens has recovered from the hypercalcemia.  I discussed CPR and ACLS issues with Mr. Derek Stevens and his family. He remains on a full CODE STATUS.  I contacted Dr. Wynelle Cleveland and she graciously agreed to admit Mr. Derek Stevens. I recommend intravenous hydration, biphosphonate therapy, and calcitonin for management of the symptomatic hypercalcemia.  60 minutes were spent with the patient today. The majority of the time was used for counseling and coordination of care.  Betsy Coder, MD  11/17/2016, 3:09 PM

## 2016-11-18 ENCOUNTER — Inpatient Hospital Stay (HOSPITAL_COMMUNITY): Payer: Medicare HMO

## 2016-11-18 LAB — COMPREHENSIVE METABOLIC PANEL
ALK PHOS: 63 U/L (ref 38–126)
ALT: 18 U/L (ref 17–63)
AST: 50 U/L — AB (ref 15–41)
Albumin: 3.8 g/dL (ref 3.5–5.0)
Anion gap: 8 (ref 5–15)
BUN: 13 mg/dL (ref 6–20)
CALCIUM: 13.3 mg/dL — AB (ref 8.9–10.3)
CO2: 31 mmol/L (ref 22–32)
CREATININE: 0.96 mg/dL (ref 0.61–1.24)
Chloride: 98 mmol/L — ABNORMAL LOW (ref 101–111)
Glucose, Bld: 118 mg/dL — ABNORMAL HIGH (ref 65–99)
Potassium: 3.4 mmol/L — ABNORMAL LOW (ref 3.5–5.1)
Sodium: 137 mmol/L (ref 135–145)
Total Bilirubin: 0.8 mg/dL (ref 0.3–1.2)
Total Protein: 8.1 g/dL (ref 6.5–8.1)

## 2016-11-18 LAB — CBC
HEMATOCRIT: 37.2 % — AB (ref 39.0–52.0)
HEMOGLOBIN: 12.6 g/dL — AB (ref 13.0–17.0)
MCH: 23.4 pg — AB (ref 26.0–34.0)
MCHC: 33.9 g/dL (ref 30.0–36.0)
MCV: 69 fL — AB (ref 78.0–100.0)
Platelets: 305 10*3/uL (ref 150–400)
RBC: 5.39 MIL/uL (ref 4.22–5.81)
RDW: 14.4 % (ref 11.5–15.5)
WBC: 10.4 10*3/uL (ref 4.0–10.5)

## 2016-11-18 LAB — MAGNESIUM: MAGNESIUM: 1.5 mg/dL — AB (ref 1.7–2.4)

## 2016-11-18 MED ORDER — HYDRALAZINE HCL 20 MG/ML IJ SOLN: 10.0000 mg | Freq: Four times a day (QID) | INTRAMUSCULAR | Status: DC | PRN

## 2016-11-18 MED ORDER — ONDANSETRON HCL 4 MG/2ML IJ SOLN: 4.0000 mg | Freq: Four times a day (QID) | INTRAMUSCULAR | Status: DC | PRN

## 2016-11-18 MED ORDER — MAGNESIUM SULFATE 2 GM/50ML IV SOLN
2.0000 g | Freq: Once | INTRAVENOUS | Status: AC
  Administered 2016-11-18: 2 g via INTRAVENOUS
  Filled 2016-11-18: qty 50

## 2016-11-18 MED ORDER — ACETAMINOPHEN 325 MG PO TABS
650.0000 mg | ORAL_TABLET | Freq: Four times a day (QID) | ORAL | Status: DC | PRN
  Filled 2016-11-18: qty 2

## 2016-11-18 MED ORDER — TRAZODONE HCL 50 MG PO TABS
50.0000 mg | ORAL_TABLET | Freq: Every day | ORAL | Status: DC
  Administered 2016-11-18: 50 mg via ORAL
  Filled 2016-11-18 (×2): qty 1

## 2016-11-18 MED ORDER — POTASSIUM CHLORIDE CRYS ER 20 MEQ PO TBCR
40.0000 meq | EXTENDED_RELEASE_TABLET | Freq: Once | ORAL | Status: AC
  Administered 2016-11-18: 40 meq via ORAL
  Filled 2016-11-18: qty 2

## 2016-11-18 NOTE — Evaluation (Signed)
Physical Therapy Evaluation Patient Details Name: Derek Stevens MRN: [id number] DOB: Feb 20, 1941 Today's Date: 11/18/2016   History of Present Illness  76 yo male admitted from oncologist office with confusion.  Pt with Hx of HTN, NHL, CVA, asthma, ETOH WD delirium, ETOH abuse, fall and recent dx of esophageal CA.  Clinical Impression  Pt admitted as above and presenting with functional mobility limitations 2* generalized weakness, ambulatory balance deficits and questionable safety awareness (pt highly distractible).  Pt should progress to dc home with 24/7 assist of family.    Follow Up Recommendations Supervision/Assistance - 24 hour;Home health PT    Equipment Recommendations  Rolling walker with 5" wheels    Recommendations for Other Services       Precautions / Restrictions Precautions Precautions: Fall Restrictions Weight Bearing Restrictions: No      Mobility  Bed Mobility Overal bed mobility: Modified Independent                Transfers Overall transfer level: Needs assistance   Transfers: Sit to/from Stand Sit to Stand: Min guard         General transfer comment: to balance in initial standing  Ambulation/Gait Ambulation/Gait assistance: Min assist;Min guard Ambulation Distance (Feet): 300 Feet Assistive device: Rolling walker (2 wheeled);1 person hand held assist Gait Pattern/deviations: Step-through pattern;Decreased dorsiflexion - right;Drifts right/left;Staggering left;Staggering right Gait velocity: decr Gait velocity interpretation: Below normal speed for age/gender General Gait Details: Pt ambulated 100' with min guard assist and intermittent instability but no LOB.  Pt ambulated additional 200' with RW, min guard assist and cues for posture and positioin from RW - decreased instability with RW  Stairs            Wheelchair Mobility    Modified Rankin (Stroke Patients Only)       Balance Overall balance assessment: Needs  assistance Sitting-balance support: No upper extremity supported;Feet supported Sitting balance-Leahy Scale: Good     Standing balance support: No upper extremity supported Standing balance-Leahy Scale: Fair                               Pertinent Vitals/Pain Pain Assessment: No/denies pain    Home Living Family/patient expects to be discharged to:: Private residence Living Arrangements: Spouse/significant other Available Help at Discharge: Family Type of Home: House Home Access: Stairs to enter   Technical brewer of Steps: 2 Home Layout: One level Home Equipment: Cane - single point      Prior Function Level of Independence: Independent with assistive device(s)         Comments: uses cane PRN (mostly outside of home)     Hand Dominance        Extremity/Trunk Assessment   Upper Extremity Assessment Upper Extremity Assessment: Generalized weakness    Lower Extremity Assessment Lower Extremity Assessment: Generalized weakness    Cervical / Trunk Assessment Cervical / Trunk Assessment: Normal  Communication   Communication: No difficulties  Cognition Arousal/Alertness: Awake/alert Behavior During Therapy: WFL for tasks assessed/performed Overall Cognitive Status: No family/caregiver present to determine baseline cognitive functioning                                 General Comments: Slow to process and follow through with cues      General Comments      Exercises     Assessment/Plan    PT  Assessment Patient needs continued PT services  PT Problem List Decreased strength;Decreased balance;Decreased mobility;Decreased knowledge of use of DME       PT Treatment Interventions Gait training;Therapeutic activities;Therapeutic exercise;Patient/family education;Functional mobility training;Balance training    PT Goals (Current goals can be found in the Care Plan section)  Acute Rehab PT Goals Patient Stated Goal: home  soon PT Goal Formulation: With patient Time For Goal Achievement: 11/25/2016 Potential to Achieve Goals: Good    Frequency Min 3X/week   Barriers to discharge        Co-evaluation               End of Session Equipment Utilized During Treatment: Gait belt Activity Tolerance: Patient tolerated treatment well Patient left: in bed;with call bell/phone within reach;with bed alarm set Nurse Communication: Mobility status PT Visit Diagnosis: Muscle weakness (generalized) (M62.81);Difficulty in walking, not elsewhere classified (R26.2);Unsteadiness on feet (R26.81)    Time: 3474-2595 PT Time Calculation (min) (ACUTE ONLY): 23 min   Charges:   PT Evaluation $PT Eval Low Complexity: 1 Procedure PT Treatments $Gait Training: 8-22 mins   PT G Codes:   PT G-Codes **NOT FOR INPATIENT CLASS** Functional Assessment Tool Used: Clinical judgement Functional Limitation: Mobility: Walking and moving around Mobility: Walking and Moving Around Current Status (G3875): At least 20 percent but less than 40 percent impaired, limited or restricted Mobility: Walking and Moving Around Goal Status [phone]): At least 1 percent but less than 20 percent impaired, limited or restricted   Pg [phone]  Goebel Hellums 11/18/2016, 1:13 PM

## 2016-11-18 NOTE — Progress Notes (Signed)
Pt attempting to climb out of bed several times and pulling at condom catheter. Pt will not follow directions despite redirecting and reorienting. Contacted on call provider for orders. Will continue to monitor.

## 2016-11-18 NOTE — Progress Notes (Addendum)
[facility] PROGRESS NOTE  Derek Stevens WCH:[id number] DOB: 1941-07-30 DOA: 11/17/2016  PCP: Kristine Garbe, MD  Brief History/Interval Summary: 76 year old African-American male with a past medical history of hypertension, history of stroke, remote history of Hodgkin's lymphoma, who was admitted to the hospital about a week ago with anorexia, weight loss. Had EGD done which showed a fungating mass in the esophagus. 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 He also has been noted to have metastatic lesion to his mediastinal lymph nodes as well as lung and liver. Was seen at oncology clinic yesterday and was noted to be encephalopathic. Found to have high calcium level. Was hospitalized for further management.  Reason for Visit: Hypercalcemia of malignancy  Consultants: Oncology  Procedures: None  Antibiotics: None  Subjective/Interval History: Patient seems to be distracted. He denies any pain. Denies any nausea, vomiting.  ROS: No chest pain or shortness of breath.  Objective:  Vital Signs  Vitals:   11/17/16 1718 11/17/16 2032 11/17/16 2300 11/18/16 0300  BP: (!) 174/87 (!) 168/92  (!) 160/105  Pulse: 65 73  73  Resp: 20 20  20   Temp: 98.2 F (36.8 C) 98.6 F (37 C)  98.5 F (36.9 C)  TempSrc: Oral Oral  Oral  SpO2: 98% 100%  100%  Weight:   54.3 kg (119 lb 11.4 oz)   Height:   5\' 6"  (1.676 m)     Intake/Output Summary (Last 24 hours) at 11/18/16 1050 Last data filed at 11/18/16 0242  Gross per 24 hour  Intake           338.75 ml  Output              350 ml  Net           -11.25 ml   Filed Weights   11/17/16 2300  Weight: 54.3 kg (119 lb 11.4 oz)    General appearance: alert, cooperative, distracted and no distress. Cachectic Resp: clear to auscultation bilaterally Cardio: regular rate and rhythm, S1, S2 normal, no murmur, click, rub or gallop GI: soft, non-tender; bowel sounds normal; no masses,  no organomegaly Extremities: extremities normal,  atraumatic, no cyanosis or edema Neurologic: Awake, alert. Oriented to place, year or month. Could not tell me the name of the president. No obvious facial asymmetry. Able to move all his extremities. No focal deficits identified.  Lab Results:  Data Reviewed: I have personally reviewed following labs and imaging studies  CBC:  Recent Labs Lab 11/17/16 1858 11/18/16 0608  WBC 8.1 10.4  HGB 13.3 12.6*  HCT 39.1 37.2*  MCV 69.4* 69.0*  PLT 283 353    Basic Metabolic Panel:  Recent Labs Lab 11/12/16 0357 11/17/16 1150 11/17/16 1858 11/18/16 0608 11/18/16 0610  NA 133* 136  --  137  --   K 3.4* 3.8  --  3.4*  --   CL 100*  --   --  98*  --   CO2 25 27  --  31  --   GLUCOSE 103* 95  --  118*  --   BUN 8 15.1  --  13  --   CREATININE 0.84 1.1 1.08 0.96  --   CALCIUM 10.1 14.8*  --  13.3*  --   MG  --   --   --   --  1.5*    GFR: Estimated Creatinine Clearance: 52.6 mL/min (by C-G formula based on SCr of 0.96 mg/dL).  Liver Function Tests:  Recent Labs  Lab 11/17/16 1150 11/18/16 0608  AST 50* 50*  ALT 17 18  ALKPHOS 69 63  BILITOT 0.64 0.8  PROT 7.7 8.1  ALBUMIN 3.7 3.8    Radiology Studies: No results found.   Medications:  Scheduled: . calcitonin  100 Units Intramuscular BID  . carvedilol  12.5 mg Oral BID WC  . enoxaparin (LOVENOX) injection  40 mg Subcutaneous Q24H  . feeding supplement (ENSURE ENLIVE)  237 mL Oral BID BM  . lisinopril  20 mg Oral Daily  . multivitamin with minerals  1 tablet Oral Daily  . pantoprazole  40 mg Oral Q0600  . potassium chloride  40 mEq Oral Once  . sodium chloride flush  3 mL Intravenous Q12H  . zoledronic acid (ZOMETA) IV  4 mg Intravenous Once   Continuous: . 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 Bisphosphonate  was ordered. However, there were some issues with IV access overnight. IV team to try again or place a PICC line. He has not been given the Zometa yet. Discussed with the nursing staff. Calcium level noted to be better this morning.   Mild acute metabolic encephalopathy Secondary to hypercalcemia. Should improve as calcium levels improved.  Essential hypertension  Continue coreg/lisinopril. Blood pressure is poorly controlled. Hydralazine as needed.  Severe malnutrition/hypomagnesemia/hypokalemia Anorexia/cachexia. Replace magnesium. 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 CT of the chest, abdomen, and pelvis 11/09/2016-upper esophagus mass, mediastinal/hilar lymphadenopathy, lung and liver metastases. Management per oncology.    DVT Prophylaxis: Lovenox    Code Status: Full code  Family Communication: Discussed with the patient. No family at bedside  Disposition Plan: Management as outlined above.    LOS: 1 day   [facility] Pager [phone] 11/18/2016, 10:50 AM  If 7PM-7AM, please contact night-coverage at [email or web address], password [facility]

## 2016-11-19 ENCOUNTER — Inpatient Hospital Stay (HOSPITAL_COMMUNITY): Payer: Medicare HMO

## 2016-11-19 LAB — URINALYSIS, ROUTINE W REFLEX MICROSCOPIC
Bacteria, UA: NONE SEEN
Bilirubin Urine: NEGATIVE
GLUCOSE, UA: NEGATIVE mg/dL
HGB URINE DIPSTICK: NEGATIVE
KETONES UR: NEGATIVE mg/dL
Leukocytes, UA: NEGATIVE
Nitrite: NEGATIVE
PROTEIN: 30 mg/dL — AB
Specific Gravity, Urine: 1.011 (ref 1.005–1.030)
Squamous Epithelial / HPF: NONE SEEN
pH: 7 (ref 5.0–8.0)

## 2016-11-19 LAB — BASIC METABOLIC PANEL
Anion gap: 9 (ref 5–15)
BUN: 13 mg/dL (ref 6–20)
CHLORIDE: 101 mmol/L (ref 101–111)
CO2: 28 mmol/L (ref 22–32)
CREATININE: 1.07 mg/dL (ref 0.61–1.24)
Calcium: 12.9 mg/dL — ABNORMAL HIGH (ref 8.9–10.3)
GFR calc non Af Amer: >60 mL/min (ref >60)
Glucose, Bld: 118 mg/dL — ABNORMAL HIGH (ref 65–99)
Potassium: 3.5 mmol/L (ref 3.5–5.1)
Sodium: 138 mmol/L (ref 135–145)

## 2016-11-19 LAB — CBC
HCT: 37.1 % — ABNORMAL LOW (ref 39.0–52.0)
Hemoglobin: 12.5 g/dL — ABNORMAL LOW (ref 13.0–17.0)
MCH: 22.9 pg — AB (ref 26.0–34.0)
MCHC: 33.7 g/dL (ref 30.0–36.0)
MCV: 67.9 fL — ABNORMAL LOW (ref 78.0–100.0)
PLATELETS: 291 10*3/uL (ref 150–400)
RBC: 5.46 MIL/uL (ref 4.22–5.81)
RDW: 14.4 % (ref 11.5–15.5)
WBC: 23.8 10*3/uL — ABNORMAL HIGH (ref 4.0–10.5)

## 2016-11-19 MED ORDER — SODIUM CHLORIDE 0.9 % IV SOLN
INTRAVENOUS | Status: DC
  Administered 2016-11-19 – 2016-11-22 (×5): via INTRAVENOUS

## 2016-11-19 MED ORDER — ACETAMINOPHEN 650 MG RE SUPP
650.0000 mg | RECTAL | Status: DC | PRN
  Administered 2016-11-19 – 2016-11-20 (×2): 650 mg via RECTAL
  Filled 2016-11-19: qty 1

## 2016-11-19 MED ORDER — ACETAMINOPHEN 325 MG PO TABS: 650.0000 mg | ORAL_TABLET | Freq: Four times a day (QID) | ORAL | Status: DC | PRN

## 2016-11-19 NOTE — Progress Notes (Signed)
Initial Nutrition Assessment  DOCUMENTATION CODES:   Severe malnutrition in context of chronic illness  INTERVENTION:   Continue Ensure Enlive po BID, each supplement provides 350 kcal and 20 grams of protein for when patient is more awake to accept. RD to continue to monitor  NUTRITION DIAGNOSIS:   Malnutrition related to chronic illness (new esophageal cancer) as evidenced by severe depletion of body fat, severe depletion of muscle mass, energy intake < or equal to 50% for > or equal to 5 days, percent weight loss.  GOAL:   Patient will meet greater than or equal to 90% of their needs  MONITOR:   PO intake, Supplement acceptance, Labs, Weight trends, Skin, I & O's  REASON FOR ASSESSMENT:   Malnutrition Screening Tool    ASSESSMENT:   76 year old African-American male with a past medical history of hypertension, history of stroke, remote history of Hodgkin's lymphoma, who was admitted to the hospital about a week ago with anorexia, weight loss. Had EGD done which showed a fungating mass in the esophagus. 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 He also has been noted to have metastatic lesion to his mediastinal lymph nodes as well as lung and liver. Was seen at oncology clinic yesterday and was noted to be encephalopathic. Found to have high calcium level. Was hospitalized for further management.  Patient lethargic today and refusing meals. Pt not taking supplements d/t lethargy. Pt's poor PO intake continues from a week ago when another RD assessed patient at [facility]. Code status changed to DNR.  Per chart review, pt has lost 31 lb since 9/24 (21% wt loss x 6 months, significant for time frame). Nutrition-Focused physical exam completed. Findings are severe fat depletion, severe muscle depletion, and mild edema.   Medications: Multivitamin with minerals daily, Protonix tablet daily Labs reviewed: Low Mg  Diet Order:  DIET DYS 3 Room service appropriate? Yes; Fluid consistency:  Thin  Skin:  Reviewed, no issues  Last BM:  4/7  Height:   Ht Readings from Last 1 Encounters:  11/17/16 5\' 6"  (1.676 m)    Weight:   Wt Readings from Last 1 Encounters:  11/17/16 119 lb 11.4 oz (54.3 kg)    Ideal Body Weight:  64.54 kg  BMI:  Body mass index is 19.32 kg/m.  Estimated Nutritional Needs:   Kcal:  6153-7943  Protein:  75-85g  Fluid:  1.7L/day  EDUCATION NEEDS:   No education needs identified at this time  Clayton Bibles, MS, RD, LDN Pager: [phone] After Hours Pager: [phone]

## 2016-11-19 NOTE — Progress Notes (Signed)
Patient wife, Mardell Cragg, states she wants patient to be DNR code status.  Notified physician, Dr. Maryland Pink.

## 2016-11-19 NOTE — Progress Notes (Addendum)
[facility] PROGRESS NOTE  Derek Stevens EYC:[id number] DOB: April 25, 1941 DOA: 11/17/2016  PCP: Kristine Garbe, MD  Brief History/Interval Summary: 76 year old African-American male with a past medical history of hypertension, history of stroke, remote history of Hodgkin's lymphoma, who was admitted to the hospital about a week ago with anorexia, weight loss. Had EGD done which showed a fungating mass in the esophagus. 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 He also has been noted to have metastatic lesion to his mediastinal lymph nodes as well as lung and liver. Was seen at oncology clinic yesterday and was noted to be encephalopathic. Found to have high calcium level. Was hospitalized for further management.  Reason for Visit: Hypercalcemia of malignancy  Consultants: Oncology  Procedures: None  Antibiotics: None  Subjective/Interval History: Overnight patient got agitated. He was given trazodone. He is arousable but goes right back to sleep this morning.   ROS: No chest pain or shortness of breath.  Objective:  Vital Signs  Vitals:   11/18/16 0300 11/18/16 1755 11/18/16 2130 11/19/16 0620  BP: (!) 160/105 (!) 153/89 135/77 140/80  Pulse: 73 74 73 72  Resp: 20 16 16 16   Temp: 98.5 F (36.9 C) 98.2 F (36.8 C) 98.5 F (36.9 C) 98.7 F (37.1 C)  TempSrc: Oral Oral Oral Oral  SpO2: 100% 99% 97% 97%  Weight:      Height:        Intake/Output Summary (Last 24 hours) at 11/19/16 0919 Last data filed at 11/19/16 1497  Gross per 24 hour  Intake                0 ml  Output             1500 ml  Net            -1500 ml   Filed Weights   11/17/16 2300  Weight: 54.3 kg (119 lb 11.4 oz)    General appearance: Lethargic today. No distress. Cachectic Resp: clear to auscultation bilaterally. Appears to have normal effort. Cardio: regular rate and rhythm, S1, S2 normal, no murmur, click, rub or gallop GI: soft, non-tender; bowel sounds normal; no masses,  no  organomegaly Extremities: extremities normal, atraumatic, no cyanosis or edema Neurologic: Lethargic. Arousable but goes right back to sleep. Mumbling. Not following commands today.  Lab Results:  Data Reviewed: I have personally reviewed following labs and imaging studies  CBC:  Recent Labs Lab 11/17/16 1858 11/18/16 0608 11/19/16 0550  WBC 8.1 10.4 23.8*  HGB 13.3 12.6* 12.5*  HCT 39.1 37.2* 37.1*  MCV 69.4* 69.0* 67.9*  PLT 283 305 026    Basic Metabolic Panel:  Recent Labs Lab 11/17/16 1150 11/17/16 1858 11/18/16 0608 11/18/16 0610 11/19/16 0550  NA 136  --  137  --  138  K 3.8  --  3.4*  --  3.5  CL  --   --  98*  --  101  CO2 27  --  31  --  28  GLUCOSE 95  --  118*  --  118*  BUN 15.1  --  13  --  13  CREATININE 1.1 1.08 0.96  --  1.07  CALCIUM 14.8*  --  13.3*  --  12.9*  MG  --   --   --  1.5*  --     GFR: Estimated Creatinine Clearance: 47.2 mL/min (by C-G formula based on SCr of 1.07 mg/dL).  Liver Function Tests:  Recent Labs Lab 11/17/16  1150 11/18/16 0608  AST 50* 50*  ALT 17 18  ALKPHOS 69 63  BILITOT 0.64 0.8  PROT 7.7 8.1  ALBUMIN 3.7 3.8    Radiology Studies: No results found.   Medications:  Scheduled: . carvedilol  12.5 mg Oral BID WC  . enoxaparin (LOVENOX) injection  40 mg Subcutaneous Q24H  . feeding supplement (ENSURE ENLIVE)  237 mL Oral BID BM  . lisinopril  20 mg Oral Daily  . multivitamin with minerals  1 tablet Oral Daily  . pantoprazole  40 mg Oral Q0600  . sodium chloride flush  3 mL Intravenous Q12H  . traZODone  50 mg Oral QHS   Continuous:  PRN:  Assessment/Plan:  Active Problems:   Hypercalcemia   Essential hypertension   Protein-calorie malnutrition, severe   Cancer of proximal third of esophagus (HCC)    Hypercalcemia from malignancy   Patient was started on calcitonin and hydration. There was a delay in the patient getting Zometa due to IV access issues. He was finally given on 4/7.  Patient's calcium level is improving.   Acute metabolic encephalopathy It appears that patient experienced some sundowning last night. He was given trazodone and is very sleepy this morning. Not following commands. It may be reasonable to get neuroimaging study at this time. We will order a CT scan.  Leukocytosis Significant increase in WBC is noted. Patient is afebrile. He is known to be on any steroids. Reason for the sudden rise in WBC is not clear. We'll check UA, chest x-ray. Repeat counts tomorrow. Monitor off of antibiotics.  Essential hypertension  Continue coreg/lisinopril. Blood pressure is reasonably well controlled. Hydralazine as needed.  Severe malnutrition/hypomagnesemia/hypokalemia Anorexia/cachexia. Replaced magnesium. 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 CT of the chest, abdomen, and pelvis 11/09/2016-upper esophagus mass, mediastinal/hilar lymphadenopathy, lung and liver metastases. Management per oncology. Appears to have some element of dysphagia due to his esophageal mass. Try dysphagia 3 diet when more awake. If he continues to have difficulty with swallowing, we'll need to involve palliative medicine for GOC.  ADDENDUM Called by the nurse stating that patient's wife wanted code status changed to DO NOT RESUSCITATE. It is noted that the oncologist, Dr. Tonita Phoenix a discussion regarding this in his office a few days ago and at that time they wanted him full code. We will change his CODE STATUS to DO NOT RESUSCITATE now.  DVT Prophylaxis: Lovenox    Code Status: Full code  Family Communication: Discussed with the patient. No family at bedside  Disposition Plan: Management as outlined above.    LOS: 2 days   [facility] Pager [phone] 11/19/2016, 9:19 AM  If 7PM-7AM, please contact night-coverage at [email or web address], password [email or web address]

## 2016-11-19 NOTE — Progress Notes (Signed)
PT Cancellation Note  Patient Details Name: Derek Stevens MRN: [id number] DOB: [birth year]   Cancelled Treatment:    Reason Eval/Treat Not Completed: Fatigue/lethargy limiting ability to participate (pt would not arouse to verbal nor tactile stimulii. Per CNA and wife he's been refusing to eat. Will follow. )   Philomena Doheny 11/19/2016, 10:59 AM  [phone]

## 2016-11-20 DIAGNOSIS — R4182 Altered mental status, unspecified: Secondary | ICD-10-CM

## 2016-11-20 DIAGNOSIS — C7800 Secondary malignant neoplasm of unspecified lung: Secondary | ICD-10-CM

## 2016-11-20 DIAGNOSIS — I10 Essential (primary) hypertension: Secondary | ICD-10-CM

## 2016-11-20 DIAGNOSIS — C787 Secondary malignant neoplasm of liver and intrahepatic bile duct: Secondary | ICD-10-CM

## 2016-11-20 DIAGNOSIS — C153 Malignant neoplasm of upper third of esophagus: Secondary | ICD-10-CM

## 2016-11-20 DIAGNOSIS — Z8572 Personal history of non-Hodgkin lymphomas: Secondary | ICD-10-CM

## 2016-11-20 DIAGNOSIS — R509 Fever, unspecified: Secondary | ICD-10-CM

## 2016-11-20 LAB — BASIC METABOLIC PANEL
Anion gap: 10 (ref 5–15)
BUN: 24 mg/dL — AB (ref 6–20)
CHLORIDE: 107 mmol/L (ref 101–111)
CO2: 25 mmol/L (ref 22–32)
CREATININE: 1.36 mg/dL — AB (ref 0.61–1.24)
Calcium: 11.8 mg/dL — ABNORMAL HIGH (ref 8.9–10.3)
GFR calc Af Amer: 58 mL/min — ABNORMAL LOW (ref >60)
GFR, EST NON AFRICAN AMERICAN: 50 mL/min — AB (ref >60)
GLUCOSE: 113 mg/dL — AB (ref 65–99)
Potassium: 3.2 mmol/L — ABNORMAL LOW (ref 3.5–5.1)
SODIUM: 142 mmol/L (ref 135–145)

## 2016-11-20 LAB — CBC
HCT: 36.2 % — ABNORMAL LOW (ref 39.0–52.0)
Hemoglobin: 12 g/dL — ABNORMAL LOW (ref 13.0–17.0)
MCH: 23.1 pg — AB (ref 26.0–34.0)
MCHC: 33.1 g/dL (ref 30.0–36.0)
MCV: 69.7 fL — ABNORMAL LOW (ref 78.0–100.0)
PLATELETS: 251 10*3/uL (ref 150–400)
RBC: 5.19 MIL/uL (ref 4.22–5.81)
RDW: 14.6 % (ref 11.5–15.5)
WBC: 17.3 10*3/uL — ABNORMAL HIGH (ref 4.0–10.5)

## 2016-11-20 MED ORDER — TRAZODONE HCL 50 MG PO TABS: 25.0000 mg | ORAL_TABLET | Freq: Every evening | ORAL | Status: DC | PRN

## 2016-11-20 MED ORDER — POTASSIUM CHLORIDE 10 MEQ/100ML IV SOLN
10.0000 meq | INTRAVENOUS | Status: AC
  Administered 2016-11-20 (×3): 10 meq via INTRAVENOUS
  Filled 2016-11-20 (×4): qty 100

## 2016-11-20 MED ORDER — MORPHINE SULFATE (PF) 2 MG/ML IV SOLN
1.0000 mg | INTRAVENOUS | Status: DC | PRN
  Administered 2016-11-20 – 2016-11-22 (×2): 1 mg via INTRAVENOUS
  Filled 2016-11-20 (×2): qty 1

## 2016-11-20 MED ORDER — SODIUM CHLORIDE 0.9 % IV SOLN: 30.0000 meq | Freq: Once | INTRAVENOUS | Status: DC

## 2016-11-20 NOTE — Progress Notes (Signed)
PHYSICAL THERAPY  Pt has medically declined and is lethargic today.  RN reported pt would not be able to participate. Pt plans to SNF HOSPICE.  Rica Koyanagi  PTA [facility]  Acute  Rehab Pager      [phone]

## 2016-11-20 NOTE — Progress Notes (Signed)
Spoke with family at bedside, patient was sleeping. Discussed referral for home hospice. Family would prefer patient to go to a facility vs home. They do not feel they are able to care for the patient with his current needs. Discussed SNF with hospice vs Hospice Facility. Awaiting Palliative Care consult to determine appropriate discharge. CSW consulted.

## 2016-11-20 NOTE — Progress Notes (Signed)
[facility] PROGRESS NOTE  CADARIUS NEVARES MBW:[id number] DOB: 03-29-1941 DOA: 11/17/2016  PCP: Kristine Garbe, MD  Brief History/Interval Summary: 76 year old African-American male with a past medical history of hypertension, history of stroke, remote history of Hodgkin's lymphoma, who was admitted to the hospital about a week ago with anorexia, weight loss. Had EGD done which showed a fungating mass in the esophagus. 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 He also has been noted to have metastatic lesion to his mediastinal lymph nodes as well as lung and liver. Was seen at oncology clinic yesterday and was noted to be encephalopathic. Found to have high calcium level. Was hospitalized for further management.  Reason for Visit: Hypercalcemia of malignancy  Consultants: Oncology  Procedures: None  Antibiotics: None  Subjective/Interval History: Patient sedated this morning. Does open his eyes but goes right back to sleep. His wife and niece at the bedside.    ROS: No chest pain or shortness of breath.  Objective:  Vital Signs  Vitals:   11/19/16 2031 11/19/16 2204 11/20/16 0548 11/20/16 0639  BP: (!) 142/70  (!) 148/95 (!) 142/82  Pulse:   94   Resp:   20   Temp:  99.7 F (37.6 C) 98.2 F (36.8 C)   TempSrc:   Oral   SpO2:   98%   Weight:      Height:        Intake/Output Summary (Last 24 hours) at 11/20/16 0933 Last data filed at 11/20/16 0836  Gross per 24 hour  Intake             1100 ml  Output              700 ml  Net              400 ml   Filed Weights   11/17/16 2300  Weight: 54.3 kg (119 lb 11.4 oz)    General appearance: More arousable today compared to yesterday. No distress. Cachectic Resp: clear to auscultation bilaterally. Appears to have normal effort. Cardio: regular rate and rhythm, S1, S2 normal, no murmur, click, rub or gallop GI: soft, non-tender; bowel sounds normal; no masses,  no organomegaly Extremities: extremities normal,  atraumatic, no cyanosis or edema Neurologic: Lethargic. Arousable but goes right back to sleep. Mumbling. Moving his extremities.  Lab Results:  Data Reviewed: I have personally reviewed following labs and imaging studies  CBC:  Recent Labs Lab 11/17/16 1858 11/18/16 0608 11/19/16 0550 11/20/16 0629  WBC 8.1 10.4 23.8* 17.3*  HGB 13.3 12.6* 12.5* 12.0*  HCT 39.1 37.2* 37.1* 36.2*  MCV 69.4* 69.0* 67.9* 69.7*  PLT 283 305 291 017    Basic Metabolic Panel:  Recent Labs Lab 11/17/16 1150 11/17/16 1858 11/18/16 0608 11/18/16 0610 11/19/16 0550 11/20/16 0629  NA 136  --  137  --  138 142  K 3.8  --  3.4*  --  3.5 3.2*  CL  --   --  98*  --  101 107  CO2 27  --  31  --  28 25  GLUCOSE 95  --  118*  --  118* 113*  BUN 15.1  --  13  --  13 24*  CREATININE 1.1 1.08 0.96  --  1.07 1.36*  CALCIUM 14.8*  --  13.3*  --  12.9* 11.8*  MG  --   --   --  1.5*  --   --     GFR: Estimated Creatinine  Clearance: 37.2 mL/min (A) (by C-G formula based on SCr of 1.36 mg/dL (H)).  Liver Function Tests:  Recent Labs Lab 11/17/16 1150 11/18/16 0608  AST 50* 50*  ALT 17 18  ALKPHOS 69 63  BILITOT 0.64 0.8  PROT 7.7 8.1  ALBUMIN 3.7 3.8    Radiology Studies: Ct Head Wo Contrast  Result Date: 11/19/2016 CLINICAL DATA:  Recently diagnosed squamous carcinoma of the esophagus, metastasis to lung/liver/nodes. Lethargic. Acute metabolic encephalopathy. EXAM: CT HEAD WITHOUT CONTRAST TECHNIQUE: Contiguous axial images were obtained from the base of the skull through the vertex without intravenous contrast. COMPARISON:  Head CT and brain MRI dated 08/15/2015. FINDINGS: Brain: There is generalized atrophy with commensurate dilatation of the ventricles and sulci. Confluent areas of chronic small vessel ischemia are seen within the bilateral periventricular and subcortical white matter regions. Small old lacunar infarcts noted within each basal ganglia region. There is no mass, hemorrhage,  edema or other evidence of acute parenchymal abnormality. No extra-axial hemorrhage. Vascular: There are chronic calcified atherosclerotic changes of the large vessels at the skull base. No unexpected hyperdense vessel. Skull: Normal. Negative for fracture or focal lesion. Sinuses/Orbits: No acute finding. Other: None. IMPRESSION: 1. No acute findings.  No intracranial mass, hemorrhage or edema. 2. Atrophy and chronic ischemic changes, as detailed above. Electronically Signed   By: Franki Cabot M.D.   On: 11/19/2016 11:55   Dg Chest Port 1 View  Result Date: 11/19/2016 CLINICAL DATA:  Leukocytosis EXAM: PORTABLE CHEST 1 VIEW COMPARISON:  CT chest dated 11/09/2016 FINDINGS: Lungs are clear.  No pleural effusion or pneumothorax. Mediastinal and right hilar adenopathy, better evaluated on recent CT. The heart is normal in size. IMPRESSION: Mediastinal and right hilar adenopathy, better evaluated on recent CT. No evidence of acute cardiopulmonary disease. Electronically Signed   By: Julian Hy M.D.   On: 11/19/2016 11:24     Medications:  Scheduled: . carvedilol  12.5 mg Oral BID WC  . enoxaparin (LOVENOX) injection  40 mg Subcutaneous Q24H  . feeding supplement (ENSURE ENLIVE)  237 mL Oral BID BM  . lisinopril  20 mg Oral Daily  . multivitamin with minerals  1 tablet Oral Daily  . pantoprazole  40 mg Oral Q0600  . sodium chloride flush  3 mL Intravenous Q12H  . traZODone  50 mg Oral QHS   Continuous: . sodium chloride 100 mL/hr at 11/20/16 0153   PRN:  Assessment/Plan:  Active Problems:   Hypercalcemia   Essential hypertension   Protein-calorie malnutrition, severe   Cancer of proximal third of esophagus (HCC)    Hypercalcemia from malignancy   Patient given Zometa. He is also on calcitonin and IV hydration. Calcium level is improving slowly.   Acute metabolic encephalopathy Likely multifactorial including hypercalcemia as well as sundowning. He was given trazodone and  continues to be somnolent. Dose of trazodone to be reduced. CT scan of the head did not show any acute findings. Continue to monitor for now.  Leukocytosis With fever Significant increase in WBC was noted on 4/8. At that time, patient was afebrile. He did have fever with a temperature up to 101.3 yesterday evening. Afebrile this morning. UA does show any abnormalities. Chest x-ray was clear. WBC is better today. Reason for fever is not entirely clear and could be related to his cancer. Monitor off of antibiotics.  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 He is found to  have a partially obstructing mass in his esophagus. CT of the chest, abdomen, and pelvis 11/09/2016-upper esophagus mass, mediastinal/hilar lymphadenopathy, lung and liver metastases. Management per oncology. Appears to have some element of dysphagia due to his esophageal mass. According to the wife, he has not been able eat for the past many weeks. Has been able to have water. All of the above was discussed with the wife. His wife does not want him to have any chemotherapy. She feels that chemotherapy will eventually end up causing more problems for the patient. She wants him to be comfortable. Discussed with his oncologist. He recommends hospice referral. Dr. Benay Spice will also discuss with wife. Regarding the setting of hospice whether be it at a skilled nursing facility or residential hospice to be determined in the next 1-2 days and based on oncologist input.   Acute renal failure with hyperkalemia Rise in BUN and creatinine noted. Continue IV fluids. Monitor urine output. Replace potassium.  Essential hypertension  Continue coreg/lisinopril. Unclear if he is really getting these medications or not. Blood pressure is reasonably well controlled. Hydralazine as needed.  Severe  malnutrition/hypomagnesemia/hypokalemia Anorexia/cachexia. Replaced magnesium. Nutrition consult  DVT Prophylaxis: Lovenox    Code Status: DO NOT RESUSCITATE Family Communication: Discussed with patient's wife Disposition Plan: Management as outlined above.    LOS: 3 days   [facility] Pager [phone] 11/20/2016, 9:33 AM  If 7PM-7AM, please contact night-coverage at [email or web address], password [facility]

## 2016-11-20 NOTE — Progress Notes (Signed)
IP PROGRESS NOTE  Subjective:   Derek Stevens is lethargic and arousable. His family is not present this morning. He had a fever last night.  Objective: Vital signs in last 24 hours: Blood pressure (!) 142/82, pulse 94, temperature 98.2 F (36.8 C), temperature source Oral, resp. rate 20, height 5\' 6"  (1.676 m), weight 119 lb 11.4 oz (54.3 kg), SpO2 98 %.  Intake/Output from previous day: 04/08 0701 - 04/09 0700 In: 1100 [I.V.:1100] Out: 900 [Urine:900]  Physical Exam:  HEENT: Dry secretions over the tongue Lungs: Clear anteriorly, no respiratory distress Cardiac: Regular rate and rhythm Abdomen: No hepatosplenomegaly Extremities: No leg edema Neurologic: Arousable, opens eyes, moans, does not follow commands    Lab Results:  Recent Labs  11/19/16 0550 11/20/16 0629  WBC 23.8* 17.3*  HGB 12.5* 12.0*  HCT 37.1* 36.2*  PLT 291 251    BMET  Recent Labs  11/19/16 0550 11/20/16 0629  NA 138 142  K 3.5 3.2*  CL 101 107  CO2 28 25  GLUCOSE 118* 113*  BUN 13 24*  CREATININE 1.07 1.36*  CALCIUM 12.9* 11.8*    Studies/Results: Ct Head Wo Contrast  Result Date: 11/19/2016 CLINICAL DATA:  Recently diagnosed squamous carcinoma of the esophagus, metastasis to lung/liver/nodes. Lethargic. Acute metabolic encephalopathy. EXAM: CT HEAD WITHOUT CONTRAST TECHNIQUE: Contiguous axial images were obtained from the base of the skull through the vertex without intravenous contrast. COMPARISON:  Head CT and brain MRI dated 08/15/2015. FINDINGS: Brain: There is generalized atrophy with commensurate dilatation of the ventricles and sulci. Confluent areas of chronic small vessel ischemia are seen within the bilateral periventricular and subcortical white matter regions. Small old lacunar infarcts noted within each basal ganglia region. There is no mass, hemorrhage, edema or other evidence of acute parenchymal abnormality. No extra-axial hemorrhage. Vascular: There are chronic calcified  atherosclerotic changes of the large vessels at the skull base. No unexpected hyperdense vessel. Skull: Normal. Negative for fracture or focal lesion. Sinuses/Orbits: No acute finding. Other: None. IMPRESSION: 1. No acute findings.  No intracranial mass, hemorrhage or edema. 2. Atrophy and chronic ischemic changes, as detailed above. Electronically Signed   By: Franki Cabot M.D.   On: 11/19/2016 11:55   Dg Chest Port 1 View  Result Date: 11/19/2016 CLINICAL DATA:  Leukocytosis EXAM: PORTABLE CHEST 1 VIEW COMPARISON:  CT chest dated 11/09/2016 FINDINGS: Lungs are clear.  No pleural effusion or pneumothorax. Mediastinal and right hilar adenopathy, better evaluated on recent CT. The heart is normal in size. IMPRESSION: Mediastinal and right hilar adenopathy, better evaluated on recent CT. No evidence of acute cardiopulmonary disease. Electronically Signed   By: Julian Hy M.D.   On: 11/19/2016 11:24    Medications: I have reviewed the patient's current medications.  Assessment/Plan: 1. Squamous cell carcinoma of the upper third of the esophagus-status post an endoscopic biopsy 11/11/2016 ? CTs of the chest, abdomen, and pelvis 11/09/2016-upper esophagus mass, mediastinal/hilar lymphadenopathy, lung and liver metastases  2. Hypercalcemia of malignancy-partially improved following Zometa 11/18/2016  3.    altered mental status secondary to hypercalcemia, delirium related to critical illness  4.    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    Hypertension  6.   Anorexia/weight loss  7.   History of tobacco and alcohol use  8.   Fever 11/19/2016-tumor fever?  Mr. Hyams has declined from a mental status  standpoint compared to when I saw him on 11/17/2016. He has persistent hypercalcemia and likely delirium related to critical  illness/hospitalization. A brain CT did not reveal evidence of an acute CNS process. It is possible his mental status will improve with further treatment of the hypercalcemia.  His wife was not present this morning. He has been placed on a no CODE BLUE status. His overall prognosis is poor. I agree with a Hospice referral. I will contact his wife by telephone to discuss the case.  Recommendations: 1. Continue intravenous hydration, check calcium 11/21/2016 2. [facility] hospice referral      LOS: 3 days   Betsy Coder, MD   11/20/2016, 1:56 PM

## 2016-11-21 LAB — CBC
HCT: 33.9 % — ABNORMAL LOW (ref 39.0–52.0)
HEMOGLOBIN: 10.9 g/dL — AB (ref 13.0–17.0)
MCH: 22.2 pg — AB (ref 26.0–34.0)
MCHC: 32.2 g/dL (ref 30.0–36.0)
MCV: 69 fL — ABNORMAL LOW (ref 78.0–100.0)
PLATELETS: 256 10*3/uL (ref 150–400)
RBC: 4.91 MIL/uL (ref 4.22–5.81)
RDW: 14.8 % (ref 11.5–15.5)
WBC: 9.5 10*3/uL (ref 4.0–10.5)

## 2016-11-21 LAB — COMPREHENSIVE METABOLIC PANEL
ALBUMIN: 2.8 g/dL — AB (ref 3.5–5.0)
ALK PHOS: 58 U/L (ref 38–126)
ALT: 16 U/L — ABNORMAL LOW (ref 17–63)
ANION GAP: 7 (ref 5–15)
AST: 63 U/L — ABNORMAL HIGH (ref 15–41)
BUN: 22 mg/dL — ABNORMAL HIGH (ref 6–20)
CALCIUM: 10.9 mg/dL — AB (ref 8.9–10.3)
CHLORIDE: 119 mmol/L — AB (ref 101–111)
CO2: 23 mmol/L (ref 22–32)
Creatinine, Ser: 1.32 mg/dL — ABNORMAL HIGH (ref 0.61–1.24)
GFR calc non Af Amer: 52 mL/min — ABNORMAL LOW (ref >60)
GLUCOSE: 87 mg/dL (ref 65–99)
Potassium: 4 mmol/L (ref 3.5–5.1)
SODIUM: 149 mmol/L — AB (ref 135–145)
Total Bilirubin: 0.6 mg/dL (ref 0.3–1.2)
Total Protein: 6.6 g/dL (ref 6.5–8.1)

## 2016-11-21 NOTE — Care Management Important Message (Signed)
Important Message  Patient Details  Name: Derek Stevens MRN: [id number] Date of Birth: [birth year]   Medicare Important Message Given:  Yes    Kerin Salen 11/21/2016, 12:02 [place] Message  Patient Details  Name: Derek Stevens MRN: [id number] Date of Birth: 1941/06/22   Medicare Important Message Given:  Yes    Kerin Salen 11/21/2016, 12:01 PM

## 2016-11-21 NOTE — Progress Notes (Addendum)
CSW following to assist with patient disposition, awaiting palliative consult to help determine patient appropriate disposition at this time.  Update: if patient is able to consume diet, will help to determine next level of care.   Kathrin Greathouse, Latanya Presser, MSW Clinical Social Worker 5E and Psychiatric Service Line [phone] 11/21/2016  8:55 AM

## 2016-11-21 NOTE — Clinical Social Work Note (Signed)
Clinical Social Work Assessment  Patient Details  Name: Derek Stevens MRN: [id number] Date of Birth: Mar 29, 1941  Date of referral:  11/21/16               Reason for consult:  End of Life/Hospice                Permission sought to share information with:  Family Supports Permission granted to share information::     Name::        Agency::     Relationship::     Contact Information:     Housing/Transportation Living arrangements for the past 2 months:  Single Family Home Source of Information:  Spouse Patient Interpreter Needed:  None Criminal Activity/Legal Involvement Pertinent to Current Situation/Hospitalization:    Significant Relationships:  Spouse Lives with:  Spouse Do you feel safe going back to the place where you live?  No Need for family participation in patient care:  Yes (Comment)  Care giving concerns:  CSW met with patient spouse about discharge plan. Patient wife feels at this time the patient is not going to get any better with chemotherapy and want to make the patient "comfortable." She reports she plans to meet with the physician in the morning to help determine best options. She reports she is familiar with [facility] residential facility, if possible she would like for her patient to discharge there. CSW contacted Harmon Pier, liaison at [facility] and inform her about patient and need for placement.    Social Worker assessment / plan:  CSW will follow up with physician and patient in the a.m to assist with discharge.   Employment status:  Retired Nurse, adult PT Recommendations:  Not assessed at this time Information / Referral to community resources:     Patient/Family's Response to care:  Agreeable csw intervention: education about Hospice  Patient/Family's Understanding of and Emotional Response to Diagnosis, Current Treatment, and Prognosis:  Patient wife expressed concerns about continued medical treatment for patient. She  reports she does not want the patient to suffer.   Emotional Assessment Appearance:    Attitude/Demeanor/Rapport:  Unable to Assess Affect (typically observed):  Unable to Assess Orientation:    Alcohol / Substance use:  Not Applicable Psych involvement (Current and /or in the community):     Discharge Needs  Concerns to be addressed:  Discharge Planning Concerns Readmission within the last 30 days:  Yes Current discharge risk:  None Barriers to Discharge:  Continued Medical Work up   [employer], LCSW 11/21/2016, 4:01 PM

## 2016-11-21 NOTE — Progress Notes (Signed)
[facility] PROGRESS NOTE  Derek Stevens EUM:[id number] DOB: 03/23/1941 DOA: 11/17/2016  PCP: Kristine Garbe, MD  Brief History/Interval Summary: 76 year old African-American male with a past medical history of hypertension, history of stroke, remote history of Hodgkin's lymphoma, who was admitted to the hospital about a week ago with anorexia, weight loss. Had EGD done which showed a fungating mass in the esophagus. 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 He also has been noted to have metastatic lesion to his mediastinal lymph nodes as well as lung and liver. Was seen at oncology clinic and was noted to be encephalopathic. Found to have high calcium level. Was hospitalized for further management.  Reason for Visit: Hypercalcemia of malignancy  Consultants: Oncology  Procedures: None  Antibiotics: None  Subjective/Interval History: Patient is awake this morning. Remains confused. Not answering questions.   ROS: Unable to do due to encephalopathy  Objective:  Vital Signs  Vitals:   11/20/16 0639 11/20/16 1505 11/20/16 2119 11/21/16 0500  BP: (!) 142/82 (!) 158/94 137/88 (!) 164/93  Pulse:  (!) 112 95 94  Resp:  18 18 17   Temp:  99 F (37.2 C) 98.3 F (36.8 C) 99.7 F (37.6 C)  TempSrc:  Oral Oral Oral  SpO2:  97% 99% 97%  Weight:      Height:        Intake/Output Summary (Last 24 hours) at 11/21/16 0939 Last data filed at 11/21/16 5400  Gross per 24 hour  Intake          2358.33 ml  Output             1300 ml  Net          1058.33 ml   Filed Weights   11/17/16 2300  Weight: 54.3 kg (119 lb 11.4 oz)    General appearance: Awake and alert. Distracted. No distress. Cachectic Resp: clear to auscultation bilaterally Cardio: regular rate and rhythm, S1, S2 normal, no murmur, click, rub or gallop GI: soft, non-tender; bowel sounds normal; no masses,  no organomegaly Extremities: extremities normal, atraumatic, no cyanosis or edema Neurologic: No  obvious focal neurological deficits. Moving his extremities.  Lab Results:  Data Reviewed: I have personally reviewed following labs and imaging studies  CBC:  Recent Labs Lab 11/17/16 1858 11/18/16 0608 11/19/16 0550 11/20/16 0629 11/21/16 0619  WBC 8.1 10.4 23.8* 17.3* 9.5  HGB 13.3 12.6* 12.5* 12.0* 10.9*  HCT 39.1 37.2* 37.1* 36.2* 33.9*  MCV 69.4* 69.0* 67.9* 69.7* 69.0*  PLT 283 305 291 251 867    Basic Metabolic Panel:  Recent Labs Lab 11/17/16 1150 11/17/16 1858 11/18/16 0608 11/18/16 0610 11/19/16 0550 11/20/16 0629 11/21/16 0619  NA 136  --  137  --  138 142 149*  K 3.8  --  3.4*  --  3.5 3.2* 4.0  CL  --   --  98*  --  101 107 119*  CO2 27  --  31  --  28 25 23   GLUCOSE 95  --  118*  --  118* 113* 87  BUN 15.1  --  13  --  13 24* 22*  CREATININE 1.1 1.08 0.96  --  1.07 1.36* 1.32*  CALCIUM 14.8*  --  13.3*  --  12.9* 11.8* 10.9*  MG  --   --   --  1.5*  --   --   --     GFR: Estimated Creatinine Clearance: 38.3 mL/min (A) (by C-G formula based on  SCr of 1.32 mg/dL (H)).  Liver Function Tests:  Recent Labs Lab 11/17/16 1150 11/18/16 0608 11/21/16 0619  AST 50* 50* 63*  ALT 17 18 16*  ALKPHOS 69 63 58  BILITOT 0.64 0.8 0.6  PROT 7.7 8.1 6.6  ALBUMIN 3.7 3.8 2.8*    Radiology Studies: Ct Head Wo Contrast  Result Date: 11/19/2016 CLINICAL DATA:  Recently diagnosed squamous carcinoma of the esophagus, metastasis to lung/liver/nodes. Lethargic. Acute metabolic encephalopathy. EXAM: CT HEAD WITHOUT CONTRAST TECHNIQUE: Contiguous axial images were obtained from the base of the skull through the vertex without intravenous contrast. COMPARISON:  Head CT and brain MRI dated 08/15/2015. FINDINGS: Brain: There is generalized atrophy with commensurate dilatation of the ventricles and sulci. Confluent areas of chronic small vessel ischemia are seen within the bilateral periventricular and subcortical white matter regions. Small old lacunar infarcts noted  within each basal ganglia region. There is no mass, hemorrhage, edema or other evidence of acute parenchymal abnormality. No extra-axial hemorrhage. Vascular: There are chronic calcified atherosclerotic changes of the large vessels at the skull base. No unexpected hyperdense vessel. Skull: Normal. Negative for fracture or focal lesion. Sinuses/Orbits: No acute finding. Other: None. IMPRESSION: 1. No acute findings.  No intracranial mass, hemorrhage or edema. 2. Atrophy and chronic ischemic changes, as detailed above. Electronically Signed   By: Franki Cabot M.D.   On: 11/19/2016 11:55   Dg Chest Port 1 View  Result Date: 11/19/2016 CLINICAL DATA:  Leukocytosis EXAM: PORTABLE CHEST 1 VIEW COMPARISON:  CT chest dated 11/09/2016 FINDINGS: Lungs are clear.  No pleural effusion or pneumothorax. Mediastinal and right hilar adenopathy, better evaluated on recent CT. The heart is normal in size. IMPRESSION: Mediastinal and right hilar adenopathy, better evaluated on recent CT. No evidence of acute cardiopulmonary disease. Electronically Signed   By: Julian Hy M.D.   On: 11/19/2016 11:24     Medications:  Scheduled: . carvedilol  12.5 mg Oral BID WC  . enoxaparin (LOVENOX) injection  40 mg Subcutaneous Q24H  . feeding supplement (ENSURE ENLIVE)  237 mL Oral BID BM  . multivitamin with minerals  1 tablet Oral Daily  . pantoprazole  40 mg Oral Q0600  . sodium chloride flush  3 mL Intravenous Q12H   Continuous: . sodium chloride 100 mL/hr at 11/21/16 0149   PRN:  Assessment/Plan:  Active Problems:   Hypercalcemia   Essential hypertension   Protein-calorie malnutrition, severe   Cancer of proximal third of esophagus (HCC)    Hypercalcemia from malignancy   Patient given Zometa. He is also on calcitonin and IV hydration. Calcium level is improving slowly.   Acute metabolic encephalopathy Likely multifactorial including hypercalcemia as well as sundowning. He was given trazodone Slightly  caused him to be more somnolent. Trazodone dose was reduced. CT scan of the head did not show any acute findings. Mental status seems to be better this morning. Continue to monitor.   Leukocytosis With fever Significant increase in WBC was noted on 4/8. At that time, patient was afebrile. He did have fever with a temperature up to 101.3 4/8 evening. Since then he has been afebrile. WBC is now normal. UA did show any abnormalities. Chest x-ray was clear. Reason for fever is not entirely clear and could be related to his cancer. Monitor off of antibiotics.  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 He is found to have a partially obstructing mass in his  esophagus. CT of the chest, abdomen, and pelvis 11/09/2016-upper esophagus mass, mediastinal/hilar lymphadenopathy, lung and liver metastases. Management per oncology. Appears to have some element of dysphagia due to his esophageal mass. According to the wife, he has not been able eat for the past many weeks. Has been able to have water. All of the above was discussed with the wife. His wife does not want him to have any chemotherapy. She feels that chemotherapy will eventually end up causing more problems for the patient. She wants him to be comfortable. Discussed with his oncologist. He recommends hospice referral. Dr. Benay Spice will also discuss with wife. Regarding the setting of hospice whether be it at a skilled nursing facility or residential hospice to be determined in the next 1-2 days. This will depend on whether his mental status improves any further and if he is will to tolerate pureed diet.  Acute renal failure with hyperkalemia Rise in BUN and creatinine noted. Continue IV fluids. Labs are slightly better today. Potassium is normal.   Essential hypertension  Continue coreg/lisinopril. Unclear if he is really getting these medications or not.  Blood pressure noted to be elevated at times. Hydralazine as needed.  Severe malnutrition/hypomagnesemia/hypokalemia Anorexia/cachexia. Replaced magnesium.   DVT Prophylaxis: Lovenox    Code Status: DO NOT RESUSCITATE Family Communication: Discussed with patient's wife 4/9 Disposition Plan: Management as outlined above. PT evaluation is pending. If he is able to take orally, then he could go to skilled nursing facility with hospice. If not, then residential hospice may be more suitable as his life expectancy will likely be about 2 weeks if he doesn't eat.    LOS: 4 days   [facility] Pager [phone] 11/21/2016, 9:39 AM  If 7PM-7AM, please contact night-coverage at [email or web address], password [facility]

## 2016-11-21 NOTE — Progress Notes (Addendum)
IP PROGRESS NOTE  Subjective:   Derek Stevens appears much more alert this morning.  Objective: Vital signs in last 24 hours: Blood pressure (!) 164/93, pulse 94, temperature 99.7 F (37.6 C), temperature source Oral, resp. rate 17, height 5\' 6"  (1.676 m), weight 119 lb 11.4 oz (54.3 kg), SpO2 97 %.  Intake/Output from previous day: 04/09 0701 - 04/10 0700 In: 2358.3 [I.V.:2358.3] Out: 1300 [Urine:1300]  Physical Exam:  HEENT: Dry secretions and thrush over the tongue Lungs: Clear anteriorly, no respiratory distress Cardiac: Regular rate and rhythm Abdomen: No hepatosplenomegaly Extremities: No leg edema Neurologic: Alert, follows commands, oriented to place and year    Lab Results:  Recent Labs  11/20/16 0629 11/21/16 0619  WBC 17.3* 9.5  HGB 12.0* 10.9*  HCT 36.2* 33.9*  PLT 251 256    BMET  Recent Labs  11/20/16 0629 11/21/16 0619  NA 142 149*  K 3.2* 4.0  CL 107 119*  CO2 25 23  GLUCOSE 113* 87  BUN 24* 22*  CREATININE 1.36* 1.32*  CALCIUM 11.8* 10.9*    Studies/Results: No results found.  Medications: I have reviewed the patient's current medications.  Assessment/Plan: 1. Squamous cell carcinoma of the upper third of the esophagus-status post an endoscopic biopsy 11/11/2016 ? CTs of the chest, abdomen, and pelvis 11/09/2016-upper esophagus mass, mediastinal/hilar lymphadenopathy, lung and liver metastases  2. Hypercalcemia of malignancy-partially improved following Zometa 11/18/2016  3.    altered mental status secondary to hypercalcemia, delirium related to critical illness  4.    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    Hypertension  6.   Anorexia/weight loss  7.   History of tobacco and alcohol use  8.   Fever 11/19/2016-tumor fever?  Derek Stevens improved today. This  likely correlates with resolution of the dehydration and hypercalcemia.  His wife is not present again this morning. I called her yesterday afternoon and there was no answer.  Chemotherapy would have a chance of partially improving the metastatic esophagus cancer and extending survival. Chemotherapy could also improve his swallowing and control the hypercalcemia.  I support skilled nursing facility placement and Hospice if the patient and his family decide against chemotherapy. I will attempt to call his wife again today.   Recommendations: 1. Continue intravenous hydration, advance diet as tolerated 2. [facility] hospice referral 3. Placement versus home with Hospice care   Addendum: I discussed the case with his wife by telephone. She does not feel he could tolerate chemotherapy. She favors placement and comfort care. She will be in the room in the a.m. on 11/22/2016 for further discussion. I will attempt to discuss options with Derek Stevens, but his mental status is not yet at baseline.   LOS: 4 days   Betsy Coder, MD   11/21/2016, 2:00 PM

## 2016-11-22 DIAGNOSIS — D72829 Elevated white blood cell count, unspecified: Secondary | ICD-10-CM

## 2016-11-22 DIAGNOSIS — E43 Unspecified severe protein-calorie malnutrition: Secondary | ICD-10-CM

## 2016-11-22 DIAGNOSIS — R400 Somnolence: Secondary | ICD-10-CM

## 2016-11-22 LAB — BASIC METABOLIC PANEL
ANION GAP: 6 (ref 5–15)
BUN: 20 mg/dL (ref 6–20)
CALCIUM: 10.3 mg/dL (ref 8.9–10.3)
CO2: 24 mmol/L (ref 22–32)
Chloride: 120 mmol/L — ABNORMAL HIGH (ref 101–111)
Creatinine, Ser: 1.04 mg/dL (ref 0.61–1.24)
Glucose, Bld: 100 mg/dL — ABNORMAL HIGH (ref 65–99)
POTASSIUM: 3.2 mmol/L — AB (ref 3.5–5.1)
Sodium: 150 mmol/L — ABNORMAL HIGH (ref 135–145)

## 2016-11-22 MED ORDER — POTASSIUM CHLORIDE 20 MEQ/15ML (10%) PO SOLN: 40.0000 meq | Freq: Once | ORAL | Status: DC

## 2016-11-22 MED ORDER — FLUCONAZOLE IN SODIUM CHLORIDE 100-0.9 MG/50ML-% IV SOLN
50.0000 mg | INTRAVENOUS | Status: DC
  Administered 2016-11-22: 50 mg via INTRAVENOUS
  Filled 2016-11-22: qty 25

## 2016-11-22 MED ORDER — MORPHINE SULFATE (CONCENTRATE) 20 MG/ML PO SOLN: 5.0000 mg | ORAL | 0 refills | Status: AC | PRN

## 2016-11-22 MED ORDER — NYSTATIN 100000 UNIT/ML MT SUSP
5.0000 mL | Freq: Four times a day (QID) | OROMUCOSAL | Status: DC
  Administered 2016-11-22 (×2): 500000 [IU] via ORAL
  Filled 2016-11-22 (×2): qty 5

## 2016-11-22 MED ORDER — FLUCONAZOLE 50 MG PO TABS
50.0000 mg | ORAL_TABLET | Freq: Every day | ORAL | Status: DC
  Filled 2016-11-22: qty 1

## 2016-11-22 MED ORDER — NYSTATIN 100000 UNIT/ML MT SUSP: 5.0000 mL | Freq: Four times a day (QID) | OROMUCOSAL | 0 refills | Status: AC

## 2016-11-22 MED ORDER — TRAMADOL HCL 50 MG PO TABS
50.0000 mg | ORAL_TABLET | Freq: Four times a day (QID) | ORAL | Status: DC | PRN
  Filled 2016-11-22: qty 1

## 2016-11-22 NOTE — Progress Notes (Signed)
Pt being transferred to [facility]. No questions or concerns at this time.  Derek Stevens W Derek Sanger, RN

## 2016-11-22 NOTE — Progress Notes (Signed)
PT Cancellation Note  Patient Details Name: Derek Stevens MRN: [id number] DOB: 1941/07/28      Noted Dr. Gearldine Shown note with prognosis and discussion with family. At this time family pursuing hospice placement, and patient unable to participate in therapy. Will sign off at this time.   If any needs arise for therapy, please don't hesitate to call. Thank you.   Clide Dales 11/22/2016, 9:06 AM  Clide Dales, PT Pager: [phone] 11/22/2016

## 2016-11-22 NOTE — Consult Note (Signed)
[facility] [employer] Received request from [place] for family interest in [facility] confirmed this morning by Dr. Benay Spice. Chart reviewed and eligibility confirmed by Dr. Tomasa Hosteller. Paper work completed with spouse in waiting area. Dr. Benay Spice to assume care at [facility]. Discharge summary has been sent.   RN Please call report to [phone].  Thank you,  Erling Conte, LCSW [phone]

## 2016-11-22 NOTE — Care Management Note (Signed)
Case Management Note  Patient Details  Name: Derek Stevens MRN: [id number] Date of Birth: 1940-09-23  Subjective/Objective:              Metastatic Ca      Action/Plan:Date:  November 22, 2016 Chart reviewed for concurrent status and case management needs. Will continue to follow patient progress. Discharge Planning: following for needs Expected discharge date: [id number] [name], BSN, [place], [place]   Expected Discharge Date:   (unknown)               Expected Discharge Plan:  [place]  In-House Referral:  Clinical Social Work  Discharge planning Services  CM Consult  Post Acute Care Choice:  NA Choice offered to:  Spouse  DME Arranged:  N/A DME Agency:  NA  HH Arranged:  NA HH Agency:  NA  Status of Service:  In process, will continue to follow  If discussed at Long Length of Stay Meetings, dates discussed:    Additional Comments:  [name], RN 11/22/2016, 9:56 AM

## 2016-11-22 NOTE — Discharge Summary (Signed)
Physician Discharge Summary  MERCURY ROCK AQT:[id number] DOB: [birth year] DOA: 11/17/2016  PCP: Kristine Garbe, MD  Admit date: 11/17/2016 Discharge date: 11/22/2016  Admitted From: Home Disposition: [facility] home  Recommendations for Outpatient Follow-up:  1. Follow up with PCP in 1-2 weeks 2. Please obtain BMP/CBC in one week. 3. Started on nystatin for oral thrush and started Roxanol for cancer-related pain.  Home Health: NA Equipment/Devices:NA  Discharge Condition: Stable CODE STATUS: DNR Diet recommendation: DIET - DYS 1 Room service appropriate? Yes; Fluid consistency: Thin Diet - low sodium heart healthy  Brief/Interim Summary: 76 year old African-American male with a past medical history of hypertension, history of stroke, remote history of Hodgkin's lymphoma, who was admitted to the hospital about a week ago with anorexia, weight loss. Had EGD done which showed a fungating mass in the esophagus. 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 He also has been noted to have metastatic lesion to his mediastinal lymph nodes as well as lung and liver. Was seen at oncology clinic and was noted to be encephalopathic. Found to have high calcium level. Was hospitalized for further management.  Discharge Diagnoses:  Active Problems:   Hypercalcemia   Essential hypertension   Protein-calorie malnutrition, severe   Cancer of proximal third of esophagus (HCC)   Hypercalcemia from malignancy   -Patient given Zometa. He is also on calcitonin and IV hydration. Calcium level is improving slowly.  -Culture on the day of discharge is 10.3, albumin was not checked but it was about 2.8 the day before that.  Acute metabolic encephalopathy -Likely multifactorial including hypercalcemia as well as sundowning.  -CT scan of the head did not show any acute findings.  -Mental status Improved.  Leukocytosis With fever Significant increase in WBC was noted on 4/8. At that time, patient  was afebrile. He did have fever with a temperature up to 101.3 4/8 evening. Since then he has been afebrile. WBC is now normal. UA did show any abnormalities.  -This is monitored off of antibiotics, WBCs back to 9.5.  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 He is found to have a partially obstructing mass in his esophagus. CT of the chest, abdomen, and pelvis 11/09/2016-upper esophagus mass, mediastinal/hilar lymphadenopathy, lung and liver metastases. Management per oncology. Appears to have some element of dysphagia due to his esophageal mass. According to the wife, he has not been able eat for the past many weeks. Has been able to have water. All of the above was discussed with the wife. His wife does not want him to have any chemotherapy. She feels that chemotherapy will eventually end up causing more problems for the patient. She wants him to be comfortable.  Discussed with his oncologist. He recommends hospice referral. Dr. Benay Spice will also discuss with wife. Regarding the setting of hospice whether be it at a skilled nursing facility or residential hospice to be determined in the next 1-2 days. This will depend on whether his mental status improves any further and if he is will to tolerate pureed diet.  Acute renal failure with hyperkalemia Rise in BUN and creatinine noted. Continue IV fluids. Labs are slightly better today. Potassium is normal.   Essential hypertension  Continue coreg/lisinopril. Unclear if he is really getting these medications or not. Blood pressure noted to be elevated at times. Hydralazine as needed.  Severe protein energy malnutrition/hypomagnesemia/hypokalemia Anorexia/cachexia. Replaced magnesium.   Hospice/end-of-life referral -Discussion was initiated by his oncologist  for hospice, patient sent to [facility]. -Dr. Benay Spice to follow-up with  her.  Esophageal thrush -Oncology recommended Diflucan, patient couldn't take the pills, started on Nystatin solution.  Discharge Instructions  Discharge Instructions    Diet - low sodium heart healthy    Complete by:  As directed    Increase activity slowly    Complete by:  As directed      Allergies as of 11/22/2016   No Known Allergies     Medication List    TAKE these medications   carvedilol 12.5 MG tablet Commonly known as:  COREG Take 12.5 mg by mouth 2 (two) times daily with a meal.   feeding supplement (ENSURE ENLIVE) Liqd Take 237 mLs by mouth 2 (two) times daily between meals.   lisinopril 20 MG tablet Commonly known as:  PRINIVIL,ZESTRIL Take 20 mg by mouth daily.   morphine 20 MG/ML concentrated solution Commonly known as:  ROXANOL Take 0.25 mLs (5 mg total) by mouth every 2 (two) hours as needed for moderate pain, severe pain or shortness of breath.   multivitamin with minerals Tabs tablet Take 1 tablet by mouth daily.   nystatin 100000 UNIT/ML suspension Commonly known as:  MYCOSTATIN Take 5 mLs (500,000 Units total) by mouth 4 (four) times daily.   pantoprazole 40 MG tablet Commonly known as:  PROTONIX Take 1 tablet (40 mg total) by mouth daily at 6 (six) AM.   simvastatin 20 MG tablet Commonly known as:  ZOCOR Take 20 mg by mouth at bedtime.       No Known Allergies  Consultations:  Treatment Team:   Florencia Reasons, MD  Ladell Pier, MD  Procedures (Echo, Carotid, EGD, Colonoscopy, ERCP)   Radiological studies: Ct Head Wo Contrast  Result Date: 11/19/2016 CLINICAL DATA:  Recently diagnosed squamous carcinoma of the esophagus, metastasis to lung/liver/nodes. Lethargic. Acute metabolic encephalopathy. EXAM: CT HEAD WITHOUT CONTRAST TECHNIQUE: Contiguous axial images were obtained from the base of the skull through the vertex without intravenous contrast. COMPARISON:  Head CT and brain MRI dated 08/15/2015. FINDINGS: Brain: There is  generalized atrophy with commensurate dilatation of the ventricles and sulci. Confluent areas of chronic small vessel ischemia are seen within the bilateral periventricular and subcortical white matter regions. Small old lacunar infarcts noted within each basal ganglia region. There is no mass, hemorrhage, edema or other evidence of acute parenchymal abnormality. No extra-axial hemorrhage. Vascular: There are chronic calcified atherosclerotic changes of the large vessels at the skull base. No unexpected hyperdense vessel. Skull: Normal. Negative for fracture or focal lesion. Sinuses/Orbits: No acute finding. Other: None. IMPRESSION: 1. No acute findings.  No intracranial mass, hemorrhage or edema. 2. Atrophy and chronic ischemic changes, as detailed above. Electronically Signed   By: Franki Cabot M.D.   On: 11/19/2016 11:55   Ct Chest W Contrast  Result Date: 11/09/2016 CLINICAL DATA:  76 year old male with history of anorexia and generalized weakness for the past 2 weeks. Diarrhea for 24 hours. Remote history of non-Hodgkin's lymphoma treated in 1994. EXAM: CT CHEST, ABDOMEN, AND PELVIS WITH CONTRAST TECHNIQUE: Multidetector CT imaging of the chest, abdomen and pelvis was performed following the standard protocol during bolus administration of intravenous contrast. CONTRAST:  65mL ISOVUE-300 IOPAMIDOL (ISOVUE-300) INJECTION 61% COMPARISON:  No priors. FINDINGS: CT CHEST FINDINGS Cardiovascular: Heart size is normal. There is no significant pericardial fluid, thickening or pericardial calcification. There is aortic atherosclerosis, as well as atherosclerosis of the great vessels of the mediastinum and  the coronary arteries, including calcified atherosclerotic plaque in the left anterior descending coronary artery. Mediastinum/Nodes: Right infrahilar lymphadenopathy measuring up to 16 mm in short axis. Conglomerate nodal mass in the right hilar region measuring 2.3 x 3.4 cm (axial image 29 of series 2). Multiple  other enlarged mediastinal lymph nodes, most notable for a paraesophageal lymph node in the inferior aspect of the middle mediastinum measuring 2.2 x 3.8 cm. Mass-like thickening of the esophageal wall at the junction of proximal to middle third of the esophagus, best appreciated on axial image 18 of series 2 and coronal image 60 of series 7. No axillary lymphadenopathy. Lungs/Pleura: There are innumerable pulmonary nodules scattered throughout the lungs bilaterally, with mid to lower lung predominance, highly concerning for widespread metastatic disease. These nodules measure up to 12 mm in diameter in the lower lobes of the lungs bilaterally (axial image 123 of series 6 in the right lower lobe and axial image 120 of series 6 in the left lower lobe). No acute consolidative airspace disease. No pleural effusions. Musculoskeletal: There are no aggressive appearing lytic or blastic lesions noted in the visualized portions of the skeleton. CT ABDOMEN PELVIS FINDINGS Hepatobiliary: There are several hypovascular masses in the liver, largest of which are centered in segment 1 (axial image 54 of series 2) and in segment 2 (axial image 54 of series 2), measuring 4.2 x 5.2 cm and 3.9 x 5.4 cm respectively. No intra or extrahepatic biliary ductal dilatation. Gallbladder is normal in appearance. Pancreas: No pancreatic mass. No pancreatic ductal dilatation. No pancreatic or peripancreatic fluid or inflammatory changes. Spleen: Unremarkable. Adrenals/Urinary Tract: Bilateral adrenal glands and bilateral kidneys are normal in appearance. No hydroureteronephrosis. Urinary bladder is normal in appearance. Stomach/Bowel: The appearance of the stomach is normal. No pathologic dilatation of small bowel or colon. The appendix is not confidently identified and may be surgically absent. Regardless, there are no inflammatory changes noted adjacent to the cecum to suggest the presence of an acute appendicitis at this time.  Vascular/Lymphatic: Aortic atherosclerosis, without evidence of aneurysm or dissection in the abdominal or pelvic vasculature. No lymphadenopathy noted in the abdomen or pelvis. Reproductive: Prostate gland and seminal vesicles are unremarkable in appearance. Other: No significant volume of ascites.  No pneumoperitoneum. Musculoskeletal: There are no aggressive appearing lytic or blastic lesions noted in the visualized portions of the skeleton. IMPRESSION: 1. 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 The exact source of primary lung malignancy is uncertain. 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 Differential considerations would also include a primary small cell carcinoma of the lung. Further evaluation with endoscopy and PET-CT is recommended for diagnostic and staging purposes. 2. Aortic atherosclerosis, in addition to left anterior descending coronary artery disease. Assessment for potential risk factor modification, dietary therapy or pharmacologic therapy may be warranted, if clinically indicated. 3. Additional incidental findings, as above. Electronically Signed   By: Vinnie Langton M.D.   On: 11/09/2016 11:15   Ct Abdomen Pelvis W Contrast  Result Date: 11/09/2016 CLINICAL DATA:  76 year old male with history of anorexia and generalized weakness for the past 2 weeks. Diarrhea for 24 hours. Remote history of non-Hodgkin's lymphoma treated in 1994. EXAM: CT CHEST, ABDOMEN, AND PELVIS WITH CONTRAST TECHNIQUE: Multidetector CT imaging of the chest, abdomen and pelvis  was performed following the standard protocol during bolus administration of intravenous contrast. CONTRAST:  65mL ISOVUE-300 IOPAMIDOL (ISOVUE-300) INJECTION 61% COMPARISON:  No priors. FINDINGS: CT  CHEST FINDINGS Cardiovascular: Heart size is normal. There is no significant pericardial fluid, thickening or pericardial calcification. There is aortic atherosclerosis, as well as atherosclerosis of the great vessels of the mediastinum and the coronary arteries, including calcified atherosclerotic plaque in the left anterior descending coronary artery. Mediastinum/Nodes: Right infrahilar lymphadenopathy measuring up to 16 mm in short axis. Conglomerate nodal mass in the right hilar region measuring 2.3 x 3.4 cm (axial image 29 of series 2). Multiple other enlarged mediastinal lymph nodes, most notable for a paraesophageal lymph node in the inferior aspect of the middle mediastinum measuring 2.2 x 3.8 cm. Mass-like thickening of the esophageal wall at the junction of proximal to middle third of the esophagus, best appreciated on axial image 18 of series 2 and coronal image 60 of series 7. No axillary lymphadenopathy. Lungs/Pleura: There are innumerable pulmonary nodules scattered throughout the lungs bilaterally, with mid to lower lung predominance, highly concerning for widespread metastatic disease. These nodules measure up to 12 mm in diameter in the lower lobes of the lungs bilaterally (axial image 123 of series 6 in the right lower lobe and axial image 120 of series 6 in the left lower lobe). No acute consolidative airspace disease. No pleural effusions. Musculoskeletal: There are no aggressive appearing lytic or blastic lesions noted in the visualized portions of the skeleton. CT ABDOMEN PELVIS FINDINGS Hepatobiliary: There are several hypovascular masses in the liver, largest of which are centered in segment 1 (axial image 54 of series 2) and in segment 2 (axial image 54 of series 2), measuring 4.2 x 5.2 cm and 3.9 x 5.4 cm respectively. No intra or extrahepatic biliary ductal dilatation. Gallbladder is normal in appearance. Pancreas: No pancreatic mass. No pancreatic ductal dilatation. No pancreatic or  peripancreatic fluid or inflammatory changes. Spleen: Unremarkable. Adrenals/Urinary Tract: Bilateral adrenal glands and bilateral kidneys are normal in appearance. No hydroureteronephrosis. Urinary bladder is normal in appearance. Stomach/Bowel: The appearance of the stomach is normal. No pathologic dilatation of small bowel or colon. The appendix is not confidently identified and may be surgically absent. Regardless, there are no inflammatory changes noted adjacent to the cecum to suggest the presence of an acute appendicitis at this time. Vascular/Lymphatic: Aortic atherosclerosis, without evidence of aneurysm or dissection in the abdominal or pelvic vasculature. No lymphadenopathy noted in the abdomen or pelvis. Reproductive: Prostate gland and seminal vesicles are unremarkable in appearance. Other: No significant volume of ascites.  No pneumoperitoneum. Musculoskeletal: There are no aggressive appearing lytic or blastic lesions noted in the visualized portions of the skeleton. IMPRESSION: 1. 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 The exact source of primary lung malignancy is uncertain. 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 Differential considerations would also include a primary small cell carcinoma of the lung. Further evaluation with endoscopy and PET-CT is recommended for diagnostic and staging purposes. 2. Aortic atherosclerosis, in addition to left anterior descending coronary artery disease. Assessment for potential risk factor modification, dietary therapy or pharmacologic therapy may be warranted, if clinically indicated. 3. Additional incidental findings, as above. Electronically Signed   By: Vinnie Langton M.D.   On: 11/09/2016  11:15   Dg Chest Port 1  View  Result Date: 11/19/2016 CLINICAL DATA:  Leukocytosis EXAM: PORTABLE CHEST 1 VIEW COMPARISON:  CT chest dated 11/09/2016 FINDINGS: Lungs are clear.  No pleural effusion or pneumothorax. Mediastinal and right hilar adenopathy, better evaluated on recent CT. The heart is normal in size. IMPRESSION: Mediastinal and right hilar adenopathy, better evaluated on recent CT. No evidence of acute cardiopulmonary disease. Electronically Signed   By: Julian Hy M.D.   On: 11/19/2016 11:24   Dg Abd Portable 1v  Result Date: 11/09/2016 CLINICAL DATA:  Diarrhea. EXAM: PORTABLE ABDOMEN - 1 VIEW COMPARISON:  Bowel gas pattern from lumbar spine radiographs 08/15/2015 FINDINGS: No bowel dilatation to suggest obstruction. Air throughout small bowel and transverse colon in the upper abdomen, nondistended. A stool ball distends the rectum spanning 8.3 cm. No evidence of free air. A calcification to the right of L5 may be vascular. Pelvic phleboliths are noted. No acute osseous abnormalities are seen. IMPRESSION: No evidence of bowel obstruction. Increased air within nondistended small bowel and transverse colon in the upper abdomen is nonspecific, may be normal for this patient or can be seen with represent enteritis. Stool ball distends the rectum. Electronically Signed   By: Jeb Levering M.D.   On: 11/09/2016 02:36     Subjective:  Discharge Exam: Vitals:   11/21/16 0500 11/21/16 1508 11/21/16 2114 11/22/16 0509  BP: (!) 164/93 (!) 145/91 (!) 168/108 (!) 149/91  Pulse: 94 72 99 73  Resp: 17 18 20 20   Temp: 99.7 F (37.6 C) 98.1 F (36.7 C) 97.8 F (36.6 C) 98.2 F (36.8 C)  TempSrc: Oral Oral Oral Oral  SpO2: 97% 97% 100% 97%  Weight:      Height:       General: Pt is alert, awake, not in acute distress Cardiovascular: RRR, S1/S2 +, no rubs, no gallops Respiratory: CTA bilaterally, no wheezing, no rhonchi Abdominal: Soft, NT, ND, bowel sounds + Extremities: no edema, no cyanosis   The  results of significant diagnostics from this hospitalization (including imaging, microbiology, ancillary and laboratory) are listed below for reference.    Microbiology: No results found for this or any previous visit (from the past 240 hour(s)).   Labs: BNP (last 3 results) No results for input(s): BNP in the last 8760 hours. Basic Metabolic Panel:  Recent Labs Lab 11/18/16 0608 11/18/16 0610 11/19/16 0550 11/20/16 0629 11/21/16 0619 11/22/16 0633  NA 137  --  138 142 149* 150*  K 3.4*  --  3.5 3.2* 4.0 3.2*  CL 98*  --  101 107 119* 120*  CO2 31  --  28 25 23 24   GLUCOSE 118*  --  118* 113* 87 100*  BUN 13  --  13 24* 22* 20  CREATININE 0.96  --  1.07 1.36* 1.32* 1.04  CALCIUM 13.3*  --  12.9* 11.8* 10.9* 10.3  MG  --  1.5*  --   --   --   --    Liver Function Tests:  Recent Labs Lab 11/17/16 1150 11/18/16 0608 11/21/16 0619  AST 50* 50* 63*  ALT 17 18 16*  ALKPHOS 69 63 58  BILITOT 0.64 0.8 0.6  PROT 7.7 8.1 6.6  ALBUMIN 3.7 3.8 2.8*   No results for input(s): LIPASE, AMYLASE in the last 168 hours. No results for input(s): AMMONIA in the last 168 hours. CBC:  Recent Labs Lab 11/17/16 1858 11/18/16 4401 11/19/16 0550 11/20/16 0272 11/21/16 5366  WBC 8.1 10.4 23.8* 17.3* 9.5  HGB 13.3 12.6* 12.5* 12.0* 10.9*  HCT 39.1 37.2* 37.1* 36.2* 33.9*  MCV 69.4* 69.0* 67.9* 69.7* 69.0*  PLT 283 305 291 251 256   Cardiac Enzymes: No results for input(s): CKTOTAL, CKMB, CKMBINDEX, TROPONINI in the last 168 hours. BNP: Invalid input(s): POCBNP CBG: No results for input(s): GLUCAP in the last 168 hours. D-Dimer No results for input(s): DDIMER in the last 72 hours. Hgb A1c No results for input(s): HGBA1C in the last 72 hours. Lipid Profile No results for input(s): CHOL, HDL, LDLCALC, TRIG, CHOLHDL, LDLDIRECT in the last 72 hours. Thyroid function studies No results for input(s): TSH, T4TOTAL, T3FREE, THYROIDAB in the last 72 hours.  Invalid input(s):  FREET3 Anemia work up No results for input(s): VITAMINB12, FOLATE, FERRITIN, TIBC, IRON, RETICCTPCT in the last 72 hours. Urinalysis    Component Value Date/Time   COLORURINE YELLOW 11/19/2016 0919   APPEARANCEUR CLEAR 11/19/2016 0919   LABSPEC 1.011 11/19/2016 0919   PHURINE 7.0 11/19/2016 0919   GLUCOSEU NEGATIVE 11/19/2016 0919   HGBUR NEGATIVE 11/19/2016 0919   BILIRUBINUR NEGATIVE 11/19/2016 0919   KETONESUR NEGATIVE 11/19/2016 0919   PROTEINUR 30 (A) 11/19/2016 0919   UROBILINOGEN 0.2 02/10/2011 1249   NITRITE NEGATIVE 11/19/2016 0919   LEUKOCYTESUR NEGATIVE 11/19/2016 0919   Sepsis Labs Invalid input(s): PROCALCITONIN,  WBC,  LACTICIDVEN Microbiology No results found for this or any previous visit (from the past 240 hour(s)).   Time coordinating discharge: Over 30 minutes  SIGNED:   Birdie Hopes, MD  [facility] 11/22/2016, 11:14 AM Pager   If 7PM-7AM, please contact night-coverage [email or web address] Password [email or web address]

## 2016-11-22 NOTE — Progress Notes (Signed)
IP PROGRESS NOTE  Subjective:   Derek Stevens appears more alert again today. He denies pain. He continues to have poor oral intake. His wife is at the bedside.  Objective: Vital signs in last 24 hours: Blood pressure (!) 149/91, pulse 73, temperature 98.2 F (36.8 C), temperature source Oral, resp. rate 20, height 5\' 6"  (1.676 m), weight 119 lb 11.4 oz (54.3 kg), SpO2 97 %.  Intake/Output from previous day: 04/10 0701 - 04/11 0700 In: 1516.7 [P.O.:240; I.V.:1276.7] Out: 420 [Urine:420]  Physical Exam:  HEENT: Dry secretions and thrush over the tongue Lungs: Moves air bilaterally, no respiratory distress, mild wheeze bilaterally Cardiac: Regular rate and rhythm Abdomen: No hepatosplenomegaly Extremities: No leg edema Neurologic: Alert, follows commands, moves all extremities to command   Lab Results:  Recent Labs  11/20/16 0629 11/21/16 0619  WBC 17.3* 9.5  HGB 12.0* 10.9*  HCT 36.2* 33.9*  PLT 251 256    BMET  Recent Labs  11/21/16 0619 11/22/16 0633  NA 149* 150*  K 4.0 3.2*  CL 119* 120*  CO2 23 24  GLUCOSE 87 100*  BUN 22* 20  CREATININE 1.32* 1.04  CALCIUM 10.9* 10.3    Studies/Results: No results found.  Medications: I have reviewed the patient's current medications.  Assessment/Plan: 1. Squamous cell carcinoma of the upper third of the esophagus-status post an endoscopic biopsy 11/11/2016 ? CTs of the chest, abdomen, and pelvis 11/09/2016-upper esophagus mass, mediastinal/hilar lymphadenopathy, lung and liver metastases  2. Hypercalcemia of malignancy- improved following Zometa 11/18/2016  3.    altered mental status secondary to hypercalcemia, delirium related to critical illness-Improved, but continues to lack comprehension regarding his diagnosis  4.    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    Hypertension  6.   Anorexia/weight loss  7.   History of tobacco and alcohol use  8.   Fever 11/19/2016-tumor fever?  Derek Stevens continues to be more alert, but he has a poor performance status. He has limited oral intake and lacks comprehension regarding his diagnosis. He is not a candidate for systemic chemotherapy. I discussed the situation with his wife. I attempted to discuss the prognosis and treatment options with Derek Stevens, but he cannot participate in this discussion. His wife understands no therapy will be curative. She requests placement with Hospice. She understands his lifespan will likely be limited 2 days or weeks. She reports other family members are aware of his prognosis.She would like to pursue transfer to [facility].  I will  follow Derek Stevens at [facility] Recommendations: 1. Diflucan for thrush 2. [facility] hospice referral for [facility] evaluation    LOS: 5 days   Betsy Coder, MD   11/22/2016, 8:30 AM

## 2016-11-22 NOTE — Progress Notes (Signed)
CSW spoke with patient spouse, she is hopeful patient will get bed at [facility]. She reports after the conversation with Dr. Benay Spice she feels better. CSW informed BP about patient referral. BP plans evaluate patient and follow up with  spouse. CSW will continue to assist with patient discharge.  Kathrin Greathouse, Latanya Presser, MSW Clinical Social Worker 5E and Psychiatric Service Line [phone] 11/22/2016  9:46 AM

## 2016-11-27 ENCOUNTER — Telehealth: Payer: Self-pay | Admitting: *Deleted

## 2016-11-27 NOTE — Telephone Encounter (Signed)
Received notification from [facility]- patient has passed away on Dec 10, 2016 at 2213pm.  Fax sent to scan and future appointments cancelled.

## 2016-12-04 ENCOUNTER — Telehealth: Payer: Self-pay | Admitting: Oncology

## 2016-12-04 NOTE — Telephone Encounter (Signed)
Faxed completed [employer] FMLA forms on 11/30/16 fax [phone] for daughter [employer] and scanned copy to [employer]

## 2016-12-12 DEATH — deceased

## 2017-12-08 IMAGING — CT CT CHEST W/ CM
2 of 5 series · 12 of 36 positions shown, 15 images · IV contrast (iopamidol)
Comparison: No priors.

CLINICAL DATA: 75-year-old male with history of anorexia and
generalized weakness for the past 2 weeks. Diarrhea for 24 hours.
Remote history of non-Hodgkin's lymphoma treated in 8448.

EXAM:
CT CHEST, ABDOMEN, AND PELVIS WITH CONTRAST
TECHNIQUE: Multidetector CT imaging of the chest, abdomen and pelvis was
performed following the standard protocol during bolus
administration of intravenous contrast.
CONTRAST:  80mL [id number] IOPAMIDOL ([id number]) INJECTION 61%

[Series 2: cap with · axial · 0.68mm/px · z∈[-595,-100]mm · 9 of 123 slices shown, 12 images]
[im 12/123  mediastinal]
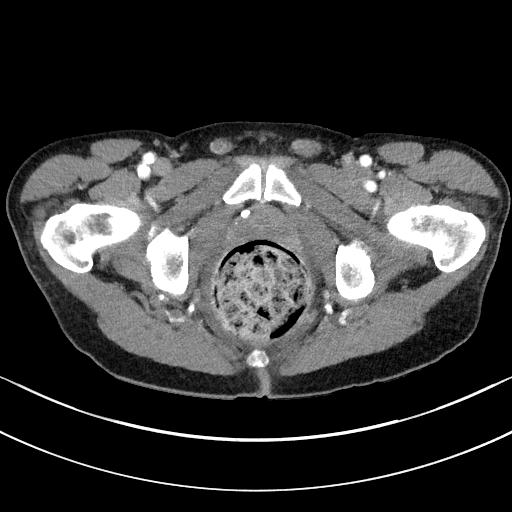
[im 12/123  lung]
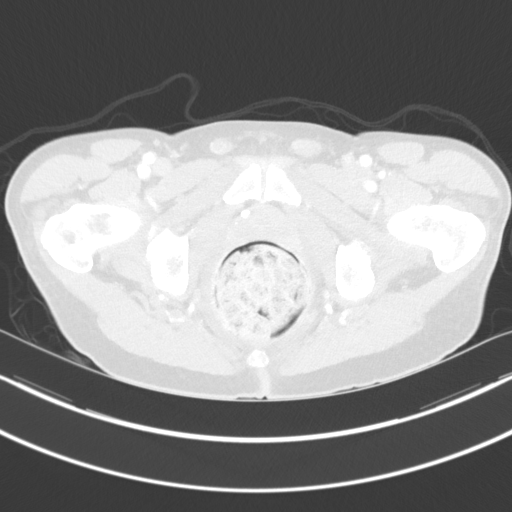
[im 23/123  lung]
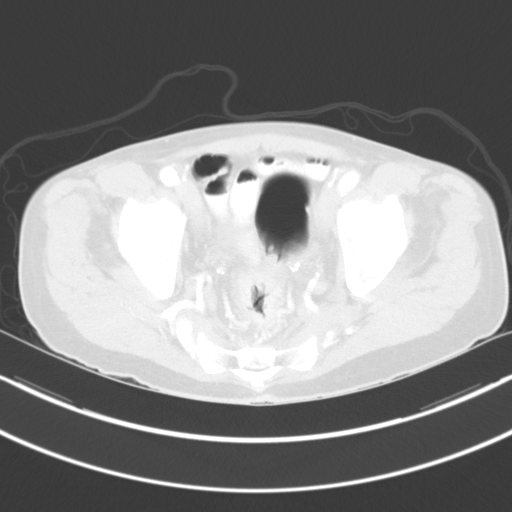
[im 34/123  lung]
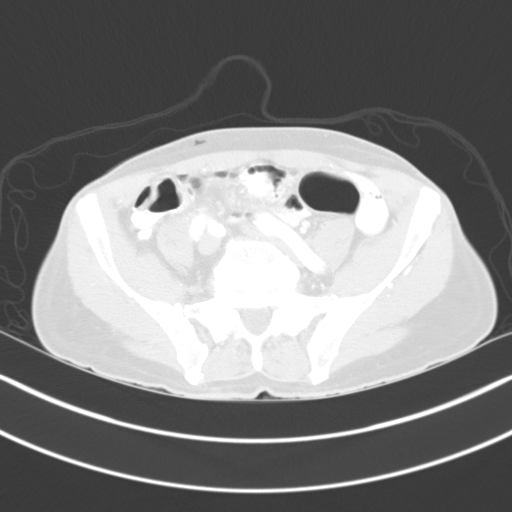
[im 45/123  lung]
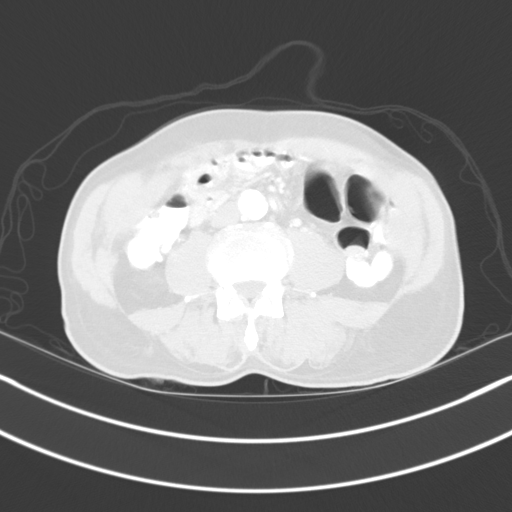
[im 67/123  mediastinal]
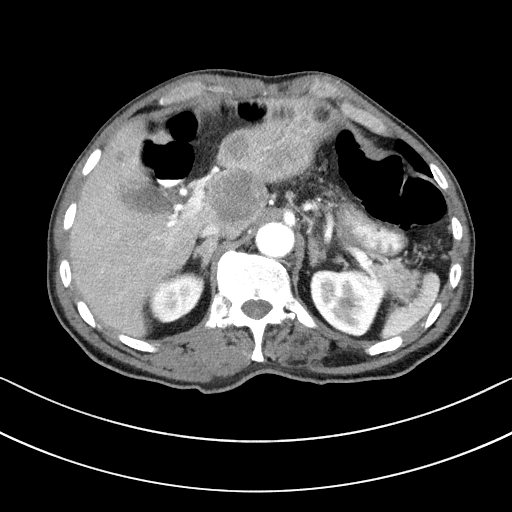
[im 67/123  lung]
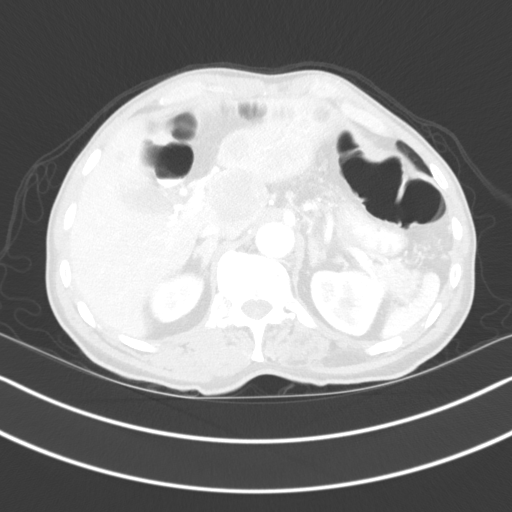
[im 78/123  lung]
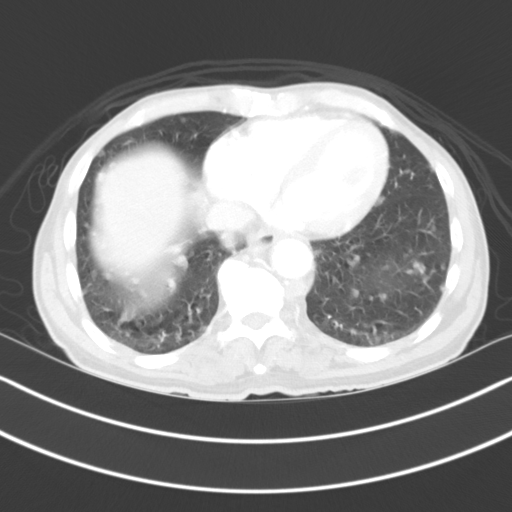
[im 89/123  lung]
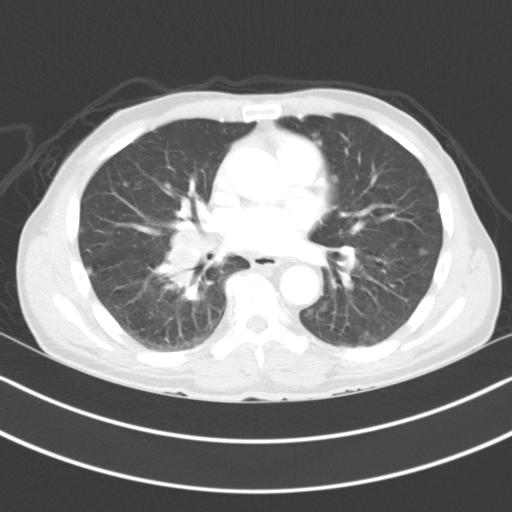
[im 100/123  lung]
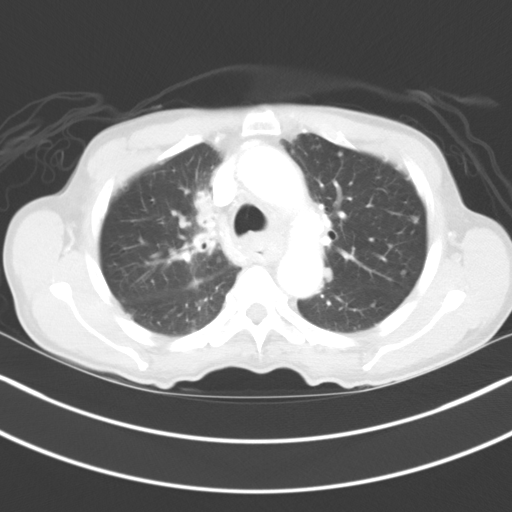
[im 111/123  mediastinal]
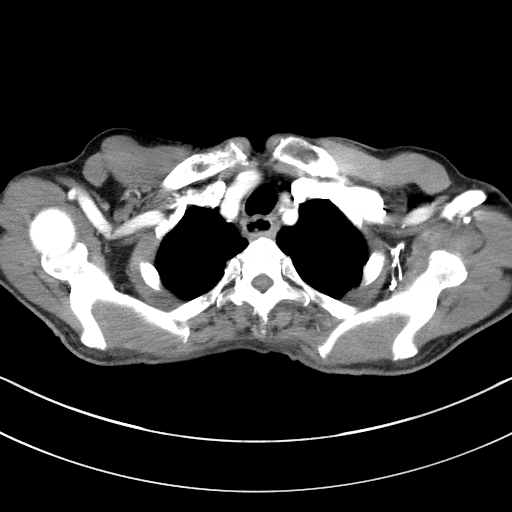
[im 111/123  lung]
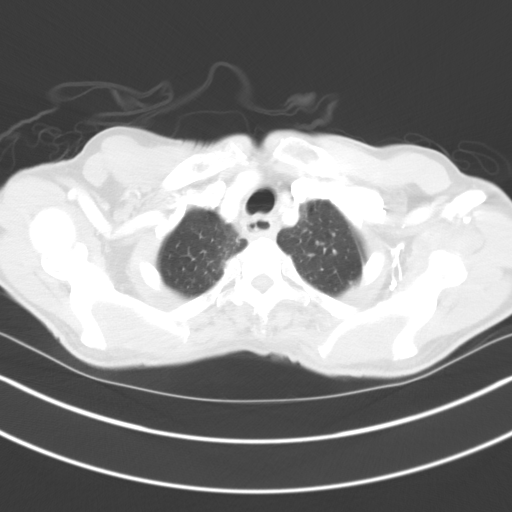

[Series 7: coronals · coronal · 0.69mm/px · 3 of 109 slices shown]
[im 22/109  lung]
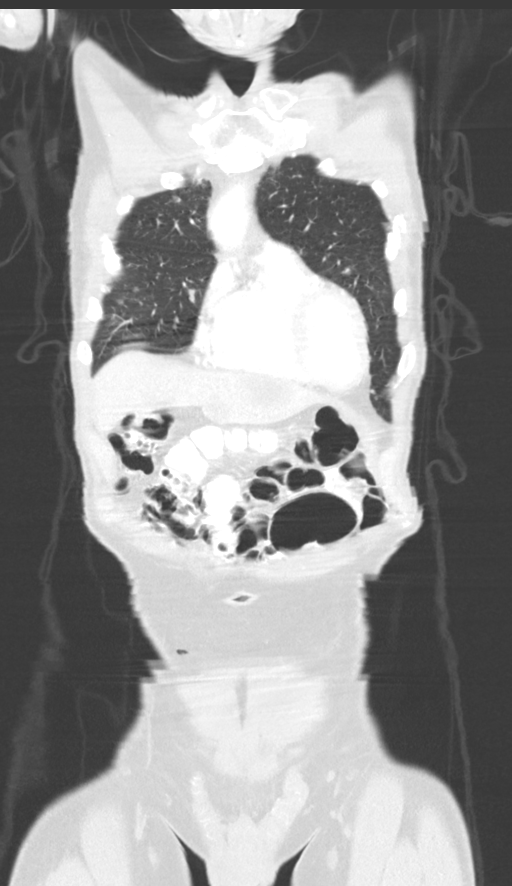
[im 44/109  lung]
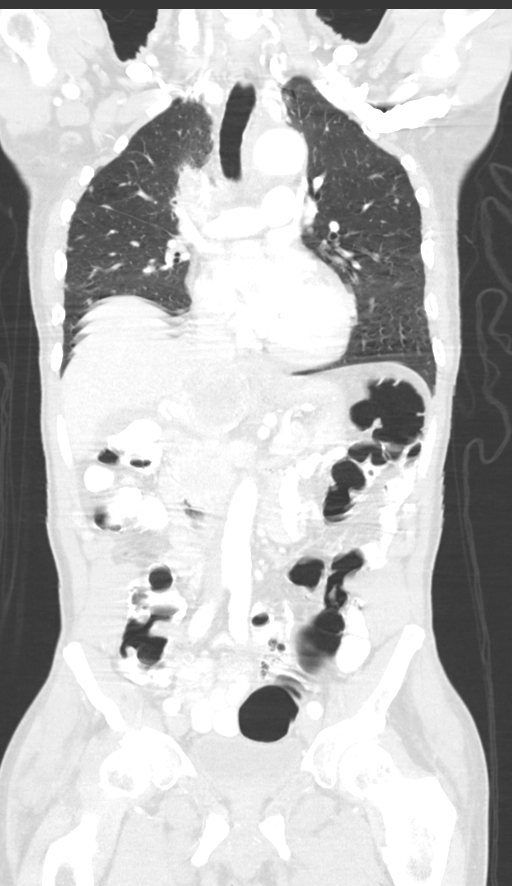
[im 65/109  lung]
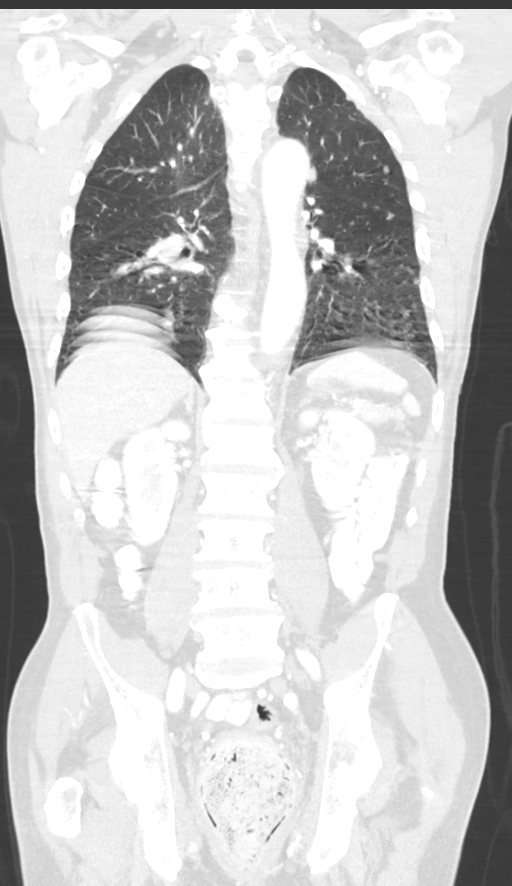

[12 of 36 positions shown; findings below may reference images not displayed]

FINDINGS: CT CHEST FINDINGS

Cardiovascular: Heart size is normal. There is no significant
pericardial fluid, thickening or pericardial calcification. There is
aortic atherosclerosis, as well as atherosclerosis of the great
vessels of the mediastinum and the coronary arteries, including
calcified atherosclerotic plaque in the left anterior descending
coronary artery.

Mediastinum/Nodes: Right infrahilar lymphadenopathy measuring up to
16 mm in short axis. Conglomerate nodal mass in the right hilar
region measuring 2.3 x 3.4 cm (axial image 29 of series 2). Multiple
other enlarged mediastinal lymph nodes, most notable for a
paraesophageal lymph node in the inferior aspect of the middle
mediastinum measuring 2.2 x 3.8 cm. Mass-like thickening of the
esophageal wall at the junction of proximal to middle third of the
esophagus, best appreciated on axial image 18 of series 2 and
coronal image 60 of series 7. No axillary lymphadenopathy.

Lungs/Pleura: There are innumerable pulmonary nodules scattered
throughout the lungs bilaterally, with mid to lower lung
predominance, highly concerning for widespread metastatic disease.
These nodules measure up to 12 mm in diameter in the lower lobes of
the lungs bilaterally (axial image 123 of series 6 in the right
lower lobe and axial image 120 of series 6 in the left lower lobe).
No acute consolidative airspace disease. No pleural effusions.

Musculoskeletal: There are no aggressive appearing lytic or blastic
lesions noted in the visualized portions of the skeleton.

CT ABDOMEN PELVIS FINDINGS

Hepatobiliary: There are several hypovascular masses in the liver,
largest of which are centered in segment 1 (axial image 54 of series
2) and in segment 2 (axial image 54 of series 2), measuring 4.2 x
5.2 cm and 3.9 x 5.4 cm respectively. No intra or extrahepatic
biliary ductal dilatation. Gallbladder is normal in appearance.

Pancreas: No pancreatic mass. No pancreatic ductal dilatation. No
pancreatic or peripancreatic fluid or inflammatory changes.

Spleen: Unremarkable.

Adrenals/Urinary Tract: Bilateral adrenal glands and bilateral
kidneys are normal in appearance. No hydroureteronephrosis. Urinary
bladder is normal in appearance.

Stomach/Bowel: The appearance of the stomach is normal. No
pathologic dilatation of small bowel or colon. The appendix is not
confidently identified and may be surgically absent. Regardless,
there are no inflammatory changes noted adjacent to the cecum to
suggest the presence of an acute appendicitis at this time.

Vascular/Lymphatic: Aortic atherosclerosis, without evidence of
aneurysm or dissection in the abdominal or pelvic vasculature. No
lymphadenopathy noted in the abdomen or pelvis.

Reproductive: Prostate gland and seminal vesicles are unremarkable
in appearance.

Other: No significant volume of ascites.  No pneumoperitoneum.

Musculoskeletal: There are no aggressive appearing lytic or blastic
lesions noted in the visualized portions of the skeleton.
IMPRESSION: 1. Today's study demonstrates widespread metastatic disease to the
lungs and the liver, in addition to right hilar and mediastinal
lymph nodes, as detailed above. The exact source of primary lung
malignancy is uncertain. Based on the appearance of the esophagus at
the junction of the proximal to middle third, this is favored to
reflect an esophageal neoplasm with metastatic disease. Differential
considerations would also include a primary small cell carcinoma of
the lung. Further evaluation with endoscopy and PET-CT is
recommended for diagnostic and staging purposes.
2. Aortic atherosclerosis, in addition to left anterior descending
coronary artery disease. Assessment for potential risk factor
modification, dietary therapy or pharmacologic therapy may be
warranted, if clinically indicated.
3. Additional incidental findings, as above.

## 2017-12-18 IMAGING — DX DG CHEST 1V PORT
1 series · 1 of 1 positions shown · non-contrast
Comparison: CT chest dated 11/09/2016

CLINICAL DATA: Leukocytosis

EXAM:
PORTABLE CHEST 1 VIEW

[chest ap]
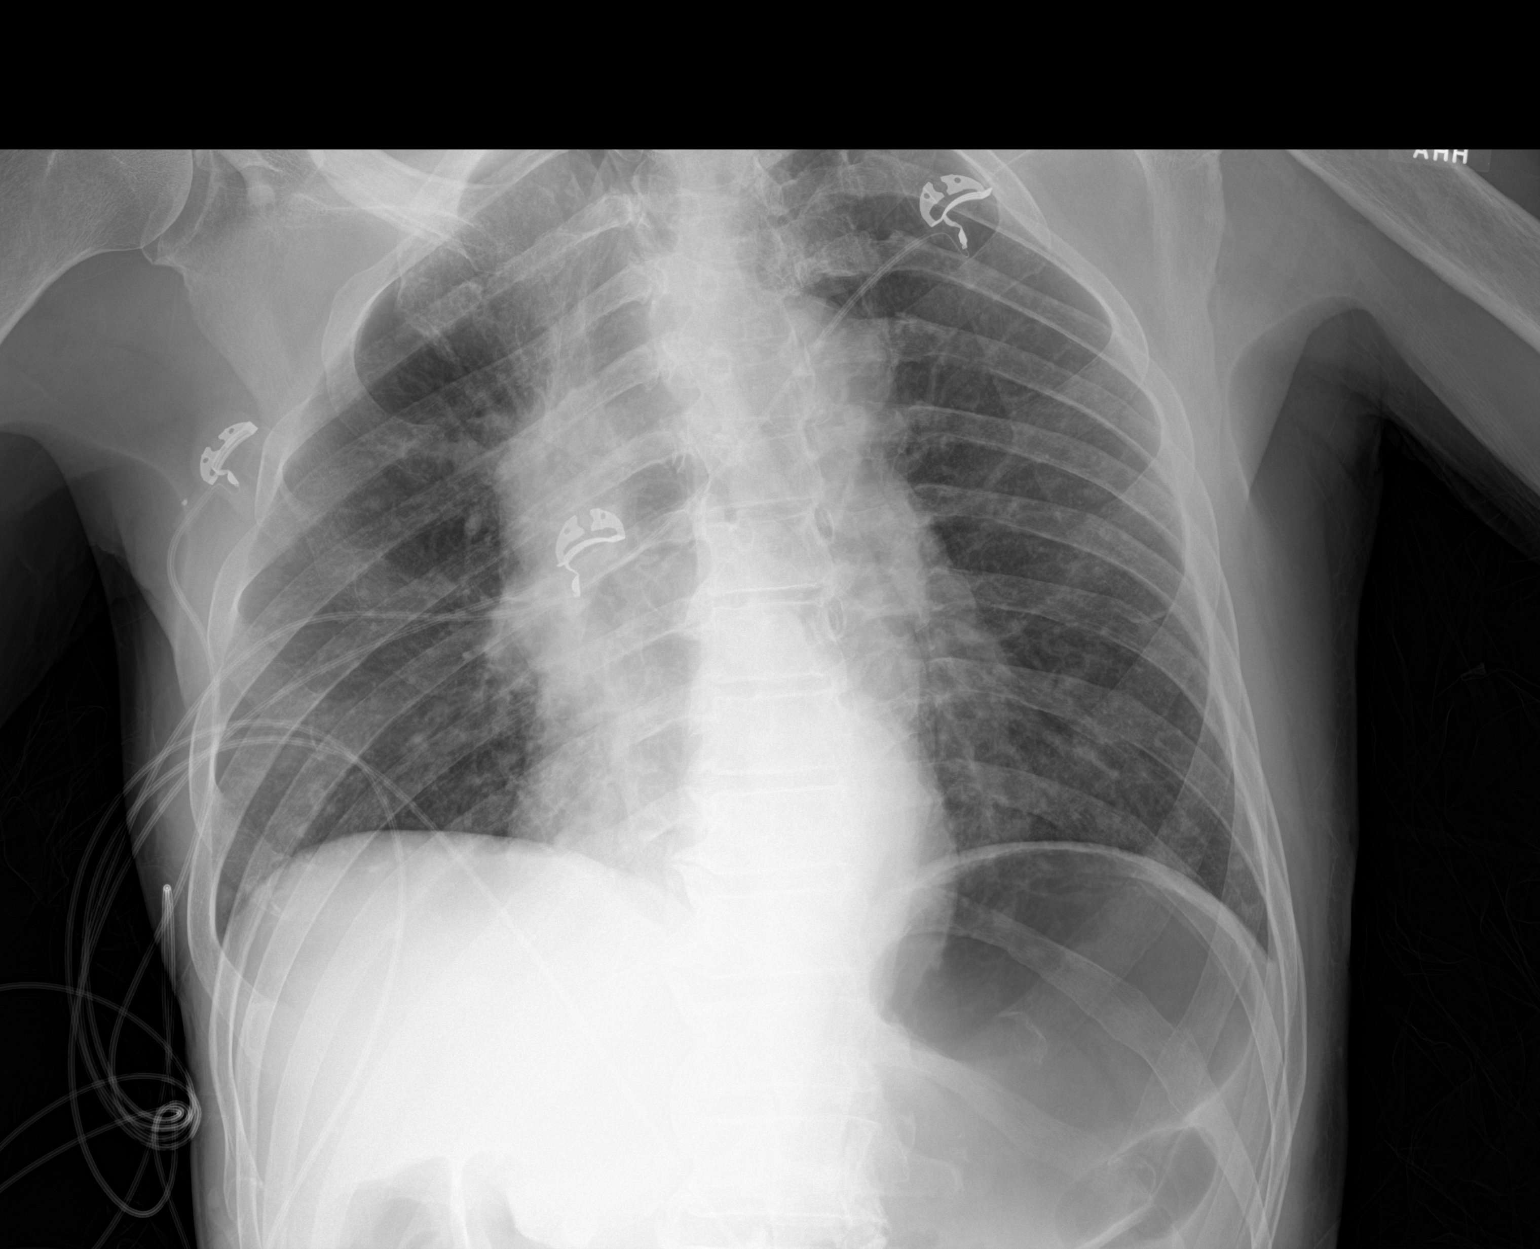

[1 of 1 positions shown; findings below may reference images not displayed]

FINDINGS: Lungs are clear.  No pleural effusion or pneumothorax.

Mediastinal and right hilar adenopathy, better evaluated on recent
CT.

The heart is normal in size.
IMPRESSION: Mediastinal and right hilar adenopathy, better evaluated on recent
CT.

No evidence of acute cardiopulmonary disease.
